# Patient Record
Sex: Male | Born: 1935 | Race: White | Hispanic: No | Smoking: Never smoker
Health system: Southern US, Community
[De-identification: ages and names within clinical notes are randomized; demographics above are authoritative.]

## PROBLEM LIST (undated history)

## (undated) DIAGNOSIS — I519 Heart disease, unspecified: Secondary | ICD-10-CM

## (undated) DIAGNOSIS — M21379 Foot drop, unspecified foot: Secondary | ICD-10-CM

## (undated) DIAGNOSIS — R7303 Prediabetes: Secondary | ICD-10-CM

## (undated) DIAGNOSIS — G6181 Chronic inflammatory demyelinating polyneuritis: Principal | ICD-10-CM

## (undated) DIAGNOSIS — R269 Unspecified abnormalities of gait and mobility: Secondary | ICD-10-CM

## (undated) DIAGNOSIS — I499 Cardiac arrhythmia, unspecified: Secondary | ICD-10-CM

## (undated) DIAGNOSIS — C61 Malignant neoplasm of prostate: Secondary | ICD-10-CM

## (undated) HISTORY — DX: Cardiac arrhythmia, unspecified: I49.9

## (undated) HISTORY — DX: Unspecified abnormalities of gait and mobility: R26.9

## (undated) HISTORY — DX: Prediabetes: R73.03

## (undated) HISTORY — DX: Heart disease, unspecified: I51.9

## (undated) HISTORY — DX: Malignant neoplasm of prostate: C61

## (undated) HISTORY — DX: Foot drop, unspecified foot: M21.379

## (undated) HISTORY — DX: Chronic inflammatory demyelinating polyneuritis: G61.81

---

## 2011-11-26 ENCOUNTER — Other Ambulatory Visit: Payer: Self-pay | Admitting: Neurology

## 2011-11-26 DIAGNOSIS — R209 Unspecified disturbances of skin sensation: Secondary | ICD-10-CM

## 2011-11-26 DIAGNOSIS — M216X9 Other acquired deformities of unspecified foot: Secondary | ICD-10-CM

## 2011-11-26 DIAGNOSIS — R269 Unspecified abnormalities of gait and mobility: Secondary | ICD-10-CM

## 2011-11-29 ENCOUNTER — Ambulatory Visit
Admission: RE | Admit: 2011-11-29 | Discharge: 2011-11-29 | Disposition: A | Discharge status: Home / Self Care | Payer: Medicare Other | Source: Ambulatory Visit | Attending: Neurology | Admitting: Neurology

## 2011-11-29 DIAGNOSIS — R209 Unspecified disturbances of skin sensation: Secondary | ICD-10-CM

## 2011-11-29 DIAGNOSIS — R269 Unspecified abnormalities of gait and mobility: Secondary | ICD-10-CM

## 2011-11-29 DIAGNOSIS — M216X9 Other acquired deformities of unspecified foot: Secondary | ICD-10-CM

## 2012-07-07 ENCOUNTER — Ambulatory Visit (INDEPENDENT_AMBULATORY_CARE_PROVIDER_SITE_OTHER): Payer: Medicare Other | Admitting: Neurology

## 2012-07-07 ENCOUNTER — Encounter: Payer: Self-pay | Admitting: Neurology

## 2012-07-07 VITALS — BP 130/80 | HR 86 | Ht 71.0 in | Wt 210.0 lb

## 2012-07-07 DIAGNOSIS — G6181 Chronic inflammatory demyelinating polyneuritis: Secondary | ICD-10-CM

## 2012-07-07 DIAGNOSIS — R269 Unspecified abnormalities of gait and mobility: Secondary | ICD-10-CM

## 2012-07-07 HISTORY — DX: Unspecified abnormalities of gait and mobility: R26.9

## 2012-07-07 HISTORY — DX: Chronic inflammatory demyelinating polyneuritis: G61.81

## 2012-07-07 NOTE — Progress Notes (Signed)
Manuel Sampson is a 76 years old right-handed Caucasian male   He was previously healthy, highly functional, was diagnosed with prostate cancer based on PSA elevation, had pelvic radiation since August 2012, lasting for 2 months, with seed implantation,  Around that period of time, he first noticed bilateral feet numbness tingling burning achy pain, couple weeks later, he noticed gait difficulty, stumbling, right worse than left, symptoms continued to get worse, plateued within 6-7 months, he had a slight improvement over the past couple months, but still had significant gait difficulty,  He denies incontinence, no bilateral hands paresthesia, no fingertips numbness tingling, no bad weakness,  MRI of lumbar reviewed, from [facility], L4-5 right posterior lateral disc extrusion with downward turning, with right L5 encroachment, moderate bilateral foraminal stenosis  Electrodiagnostic study in November 18 2011, showed prolonged F-wave latency of all the upper extremity motor nerves tested. Absence bilateral lower extremity motor and sensory responses, activity denervations involving bilateral distal leg muscles, consistent with demyelinating polyradiculoneuropathy   CSF showed elevated TP  72, glucose 79,  Labs, normal CMP, with exception of glucose 163, PEP,cbc, lyme titer, ssa,b, tsh,ace, ana, rpr, vit b12, crp,  UPDATE March 26th 2014:  He began to receive IVIG treatment from home health since Sep 2013,  1g/kg every 3 weeks, he tolerated the treatment very well, he reported significant improvement, he no longer walks with a cane,He go to church on Sunday without his ankle bracelet on sometimes. The numbness in his neck is now receding to his plantar feet only.    Review of Systems  Out of a complete 14 system review, the patient complains of only the following symptoms, and all other reviewed systems are negative.   Gait difficulty   Physical Exam  Neck: supple no carotid  bruits Respiratory: clear to auscultation bilaterally Cardiovascular: regular rate rhythm, systolic ejection murmur  Neurologic Exam  Mental Status: pleasant, awake, alert, cooperative to history, talking, and casual conversation. Cranial Nerves: CN II-XII pupils were equal round reactive to light.  Fundi were sharp bilaterally.  Extraocular movements were full.  Visual fields were full on confrontational test.  Facial sensation and strength were normal.  Hearing was intact to finger rubbing bilaterally.  Uvula tongue were midline.  Head turning and shoulder shrugging were normal and symmetric.  Tongue protrusion into the cheeks strength were normal.  Motor: mild limitation due to left shoulder pain,  he has no significant  bilateral upper extremity weakness. No bilateral proximal lower extremity weakness, Ankle dorsiflexion 2/3, plantar flexion 2/3, eversion 1/3, inversion 1/3.  Sensory: length dependent decreased light touch, pinprick to distal shin level, decreased bilateral toe proprioception 1/3 mistakes, and absent vibratory sensation to ankle level   Coordination: Normal finger-to-nose, heel-to-shin.  There was no dysmetria noticed. Gait and Station: need to push up from seated position, very unsteady, bilateral flail gait, right worse than left. obvious foot drop. improved gait with  AFO's in place.  Romberg sign: negative with feet apart in shoulder width Reflexes: Deep tendon reflexes: Biceps: 1/1, Brachioradialis: 1/1, Triceps: 1/1, Pateller: 0/0  Achilles: 0/0.  Plantar responses are flexor.   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   1. continue 1g/kg (0.5 g/kgx2) q 3 weeks. 2. RTC in 3 months.

## 2012-07-07 NOTE — Patient Instructions (Signed)
Continue home ivig treatment.

## 2012-07-07 NOTE — Telephone Encounter (Signed)
Error

## 2012-08-05 ENCOUNTER — Telehealth: Payer: Self-pay | Admitting: Neurology

## 2012-08-05 NOTE — Telephone Encounter (Signed)
Chart reviewed, he is to continue ivig 1gm/kg q 3weeks, (0.5mg  x2 days), no change in dosage,   Lupita Leash, please let her know.

## 2012-08-05 NOTE — Telephone Encounter (Signed)
Manuel Sampson From [facility] called. She was informed that patient was to have an increase in his IVIG but has not received new orders to increase dose. Shwe states that he is due on 4/30 and 5/1. She plans for 45 gms to be shipped. Please advise. [phone]

## 2012-08-06 NOTE — Telephone Encounter (Signed)
I called and spoke to [place] and since that time she had spoken to wife and the pt will be getting the same dosing and when pt has appt this may be changed at that time.   She verbalized understanding.

## 2012-10-07 ENCOUNTER — Encounter: Payer: Self-pay | Admitting: Neurology

## 2012-10-07 ENCOUNTER — Ambulatory Visit (INDEPENDENT_AMBULATORY_CARE_PROVIDER_SITE_OTHER): Payer: Medicare Other | Admitting: Neurology

## 2012-10-07 VITALS — BP 159/82 | HR 85 | Ht 71.0 in | Wt 159.0 lb

## 2012-10-07 DIAGNOSIS — R269 Unspecified abnormalities of gait and mobility: Secondary | ICD-10-CM

## 2012-10-07 DIAGNOSIS — G6181 Chronic inflammatory demyelinating polyneuritis: Secondary | ICD-10-CM

## 2012-10-07 NOTE — Progress Notes (Signed)
History of present illness: Manuel Sampson is a 77 years old right-handed Caucasian male   He was previously healthy, highly functional, was diagnosed with prostate cancer based on PSA elevation, had pelvic radiation since August 2012, lasting for 2 months, with seed implantation,  Around that period of time, he first noticed bilateral feet numbness tingling burning achy pain, couple weeks later, he noticed gait difficulty, stumbling, right worse than left, symptoms continued to get worse, plateued within 6-7 months, he had a slight improvement over the past couple months, but still had significant gait difficulty,  He denies incontinence, no bilateral hands paresthesia, no fingertips numbness tingling, no bad weakness,  MRI of lumbar reviewed, from [facility], L4-5 right posterior lateral disc extrusion with downward turning, with right L5 encroachment, moderate bilateral foraminal stenosis  Electrodiagnostic study in November 18 2011, showed prolonged F-wave latency of all the upper extremity motor nerves tested. Absence bilateral lower extremity motor and sensory responses, activity denervations involving bilateral distal leg muscles, consistent with demyelinating polyradiculoneuropathy   CSF showed elevated TP  72, glucose 79,  Labs, normal CMP, with exception of glucose 163, PEP,cbc, lyme titer, ssa,b, tsh,ace, ana, rpr, vit b12, crp,  He began to receive IVIG treatment from home health since Sep 2013,  1g/kg every 3 weeks, he tolerated the treatment very well, he reported significant improvement, he no longer walks with a cane,He go to church on Sunday without his ankle bracelet on sometimes. The numbness in his neck is now receding to his plantar feet only.  UPDATE June 26th 2014: He continues to show improvement with ivig, he is no longer staggering as much, walk with out assistant, wearing bilateral ankle brace, he had infusion every 3 weeks, no significant side effect noticed.  Review  of Systems  Out of a complete 14 system review, the patient complains of only the following symptoms, and all other reviewed systems are negative.   Gait difficulty  Physical Exam  Neck: supple no carotid bruits Respiratory: clear to auscultation bilaterally Cardiovascular: regular rate rhythm, systolic ejection murmur  Neurologic Exam  Mental Status: pleasant, awake, alert, cooperative to history, talking, and casual conversation. Cranial Nerves: CN II-XII pupils were equal round reactive to light.  Fundi were sharp bilaterally.  Extraocular movements were full.  Visual fields were full on confrontational test.  Facial sensation and strength were normal.  Hearing was intact to finger rubbing bilaterally.  Uvula tongue were midline.  Head turning and shoulder shrugging were normal and symmetric.  Tongue protrusion into the cheeks strength were normal.  Motor: mild limitation due to left shoulder pain,  he has no significant  bilateral upper extremity weakness. No bilateral proximal lower extremity weakness, Ankle dorsiflexion 2/3, plantar flexion 3/4, eversion 1/3, inversion 1/3.  Sensory: length dependent decreased light touch, pinprick to mid foot level, absent bilateral toe proprioception, and absent vibratory sensation to mid shin level   Coordination: Normal finger-to-nose, heel-to-shin.  There was no dysmetria noticed. Gait and Station: need to push up from seated position, very unsteady, bilateral flail gait, right worse than left. obvious foot drop. improved gait with  AFO's in place.  Romberg sign: negative with feet apart in shoulder width Reflexes: Deep tendon reflexes: Biceps: 1/1, Brachioradialis: 1/1, Triceps: 1/1, Pateller: 0/0  Achilles: 0/0.  Plantar responses are flexor.   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   1. continue 1g/kg (0.5 g/kgx2) q 3 weeks. 2.  RTC in 6-9 months

## 2012-12-22 ENCOUNTER — Telehealth: Payer: Self-pay | Admitting: *Deleted

## 2012-12-22 NOTE — Telephone Encounter (Signed)
Beryle Beams from [facility] called and relayed that reauthorization for IVIG therapy done (continuation of previous therapy)  [employer] Part D.  Good till 12-21-2013, this is for 45 Grams daily for 2 days every 3 wks.  (gammaguard 10%).  I called and LMVM for [place] that received message.

## 2013-03-25 ENCOUNTER — Other Ambulatory Visit: Payer: Self-pay | Admitting: Neurology

## 2013-03-25 NOTE — Telephone Encounter (Signed)
I called patient's home and left VM that I am faxing Rx for IVIG today.

## 2013-03-25 NOTE — Telephone Encounter (Signed)
Rx faxed to [facility] Per Vikki Ports, Charity fundraiser.

## 2013-03-30 ENCOUNTER — Telehealth: Payer: Self-pay | Admitting: Neurology

## 2013-03-30 NOTE — Telephone Encounter (Signed)
Judeth Cornfield from [facility] calling for clarification on patient's Gammagard liquid refill. Please call.

## 2013-04-01 NOTE — Telephone Encounter (Signed)
Spoke to [place] and she initially received a blank form with just a signature, it has since been completed and she received corrected form.

## 2013-04-27 ENCOUNTER — Telehealth: Payer: Self-pay | Admitting: *Deleted

## 2013-04-27 NOTE — Telephone Encounter (Signed)
This is for IVIG, which is handled by the nurses.  Will forward message.

## 2013-04-27 NOTE — Telephone Encounter (Signed)
I have been trying to get through to [facility] and have been put on hold twice today and no representative has picked up after 10-15 minutes.  I called Renaee Munda to see what she can do, she will try to download an application.  I have informed patient's wife and will let her know as soon as we get the information.

## 2013-04-28 ENCOUNTER — Telehealth: Payer: Self-pay | Admitting: Neurology

## 2013-04-28 NOTE — Telephone Encounter (Signed)
IVIG is handled by nurses.  By viewing the chart, Butch Penny is already working on this.

## 2013-04-28 NOTE — Telephone Encounter (Signed)
Dr. Krista Blue I heard from the rep from [facility] and his IVIG will have to be changed to Gamunex C 10% from Gammagard Liquid 10% , as that is the IVIG in his formulary, (patient changed insurance.)   Please write new Rx and I will fax to specialty pharmacy.

## 2013-04-28 NOTE — Telephone Encounter (Signed)
Spoke to [place], she has received prescription.

## 2013-04-28 NOTE — Telephone Encounter (Signed)
New prescription was written and faxed to [facility], confirmation received.  Authorization # [id number].  I spoke to wife and she is aware, pharmacy will ship medication.

## 2013-04-28 NOTE — Telephone Encounter (Signed)
Gina with [facility] called to check the status of a pre authorization form for Gamunex for this patient.  She stated that she needed this today if possible so that she can send to patient.  Please call [phone] and ask for Gina.  Thank you.

## 2013-05-02 ENCOUNTER — Ambulatory Visit: Payer: Medicare Other | Admitting: Neurology

## 2013-05-24 ENCOUNTER — Telehealth: Payer: Self-pay | Admitting: Neurology

## 2013-05-24 NOTE — Telephone Encounter (Signed)
Colletta Maryland with [employer] calling to state that patient was on Gammagard  IV - insurance required him to be on Gamunex-C. Patient has had 2 treatments of Gamunex-C and does not feel that is working as well. He is unstable walking. He is weaker. Colletta Maryland questioning whether Dr. Krista Blue can contact patient's insurance company stating Gammagard as medically necessary for patient.

## 2013-05-25 NOTE — Telephone Encounter (Signed)
Butch Penny: Please call patient's to find out eventually change of IVIG has made a difference, if patient agrees that there is significant difference,  please   find out the procedure I need to go through to talk with his insurance company about specific kind of IVIG

## 2013-06-03 NOTE — Telephone Encounter (Signed)
I spoke with patient and he stated he doesn't feel he has as good a benefit with the Gamunex-C as he did with the Gammagard.  His wife thinks he should give it a little more time to see if it improves.  I also spoke to [facility] the pharmacist with [employer] and he is due to get his shipment for next infusion sent on 06-08-13, so she requested no change be may right now, otherwise he may miss the dose for next week.    I spoke to [employer] and they informed me that we would have to call in to do a formulary exception, and get it authorized if the doctor wanted to change the medication.

## 2013-06-03 NOTE — Telephone Encounter (Signed)
That is OK. Let him complete this infusion as planned to see how he responds to it.    We will check his response on his follow up visit in March 6th.

## 2013-06-06 NOTE — Telephone Encounter (Signed)
Left message that the doctor wants him to complete his infusion this month and will check his response on his follow up visit on 06-17-13.

## 2013-06-17 ENCOUNTER — Encounter (INDEPENDENT_AMBULATORY_CARE_PROVIDER_SITE_OTHER): Payer: Self-pay

## 2013-06-17 ENCOUNTER — Encounter: Payer: Self-pay | Admitting: Neurology

## 2013-06-17 ENCOUNTER — Ambulatory Visit (INDEPENDENT_AMBULATORY_CARE_PROVIDER_SITE_OTHER): Payer: Medicare Other | Admitting: Neurology

## 2013-06-17 VITALS — BP 153/77 | HR 72 | Ht 72.0 in | Wt 210.0 lb

## 2013-06-17 DIAGNOSIS — G6181 Chronic inflammatory demyelinating polyneuritis: Secondary | ICD-10-CM

## 2013-06-17 MED ORDER — DULOXETINE HCL 60 MG PO CPEP: 60.0000 mg | ORAL_CAPSULE | Freq: Every day | ORAL | Status: DC

## 2013-06-17 MED ORDER — GABAPENTIN 100 MG PO CAPS: 300.0000 mg | ORAL_CAPSULE | Freq: Three times a day (TID) | ORAL | Status: DC

## 2013-06-17 NOTE — Progress Notes (Signed)
History of present illness: Manuel Sampson is a 78 years old right-handed Caucasian male   He was previously healthy, highly functional, was diagnosed with prostate cancer based on PSA elevation, had pelvic radiation since August 2012, lasting for 2 months, with seed implantation,  Around that period of time, he first noticed bilateral feet numbness tingling burning achy pain, couple weeks later, he noticed gait difficulty, stumbling, right worse than left, symptoms continued to get worse, plateued within 6-7 months, he had a slight improvement over the past couple months, but still had significant gait difficulty,  He denies incontinence, no bilateral hands paresthesia, no fingertips numbness tingling, no bad weakness,  MRI of lumbar reviewed, from [facility], L4-5 right posterior lateral disc extrusion with downward turning, with right L5 encroachment, moderate bilateral foraminal stenosis  Electrodiagnostic study in November 18, 2011, showed prolonged F-wave latency of all the upper extremity motor nerves tested. Absence bilateral lower extremity motor and sensory responses, activity denervations involving bilateral distal leg muscles, consistent with demyelinating polyradiculoneuropathy   CSF showed elevated TP  72, glucose 79,  Labs, normal CMP, with exception of glucose 163, PEP,cbc, lyme titer, ssa,b, tsh,ace, ana, rpr, vit b12, crp,  He began to receive IVIG treatment from home health since Sep 2013,  1g/kg every 3 weeks, he tolerated the treatment very well, he reported significant improvement, he no longer walks with a cane, He go to church on Sunday without his ankle bracelet on sometimes. The numbness in his neck is now receding to his plantar feet only.  UPDATE June 26th 2014: He continues to show improvement with ivig, he is no longer staggering as much, walk with out assistant, wearing bilateral ankle brace, he had infusion every 3 weeks, no significant side effect  noticed.  UPDATE March 6th 2015: Overall, he does very well, work 8 hours each day, he was switched from [place] to [place] since February 2015. He completed two infusions, through home health agent [facility], last infusion was March second, and third, he complains of increased bilateral feet burning, numbness, especially at the ball of his feet, he continue to walk with a brace, which has been very helpful  He has been taking Cymbalta 60 mg every morning for a while, was not sure it is helping him, he denies significant side effect  He denies bilateral upper extremity symptoms, no incontinence  Review of Systems  Out of a complete 14 system review, the patient complains of only the following symptoms, and all other reviewed systems are negative.   Gait difficulty, bilateral feet paresthesia   Physical Exam  Neck: supple no carotid bruits Respiratory: clear to auscultation bilaterally Cardiovascular: regular rate rhythm, systolic ejection murmur  Neurologic Exam  Mental Status: pleasant, awake, alert, cooperative to history, talking, and casual conversation. Cranial Nerves: CN II-XII pupils were equal round reactive to light.  Fundi were sharp bilaterally.  Extraocular movements were full.  Visual fields were full on confrontational test.  Facial sensation and strength were normal.  Hearing was intact to finger rubbing bilaterally.  Uvula tongue were midline.  Head turning and shoulder shrugging were normal and symmetric.  Tongue protrusion into the cheeks strength were normal.  Motor:   he has no significant  bilateral upper extremity weakness. No bilateral proximal lower extremity weakness, Ankle dorsiflexion 2/3, plantar flexion 3/4, eversion  2 /3, inversion  2 /3.  Sensory: length dependent decreased light touch, pinprick to  ankle  level,  mild decreased right toe  proprioception, and absent  vibratory sensation to  ankle level.  Coordination: Normal finger-to-nose, heel-to-shin.  There  was no dysmetria noticed. Gait and Station: need to push up from seated position, very unsteady, bilateral flail gait, right worse than left. obvious foot drop. improved gait with  AFO's in place.  Romberg sign:  mildly positive  with feet apart in shoulder width Reflexes: Deep tendon reflexes: Biceps: 1/1, Brachioradialis: 1/1, Triceps: 1/1, Pateller: 0/0  Achilles: 0/0.  Plantar responses are flexor.   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 continue 1g/kg (0.5 g/kgx2) q 3 weeks. 2. EMG/NCS. 3. Add on Neurontin for symptomatic control.

## 2013-06-20 ENCOUNTER — Telehealth: Payer: Self-pay | Admitting: Neurology

## 2013-06-20 MED ORDER — DULOXETINE HCL 60 MG PO CPEP: 60.0000 mg | ORAL_CAPSULE | Freq: Every day | ORAL | Status: DC

## 2013-06-20 MED ORDER — GABAPENTIN 100 MG PO CAPS: 300.0000 mg | ORAL_CAPSULE | Freq: Three times a day (TID) | ORAL | Status: DC

## 2013-06-20 NOTE — Telephone Encounter (Signed)
Rx's have been resent.  Incorrect pharmacy removed from chart

## 2013-06-20 NOTE — Telephone Encounter (Signed)
Pt's wife called regarding the prescriptions that were prescribed on 06-17-13.  [facility]  [facility] [place]  Thank you

## 2013-06-21 ENCOUNTER — Telehealth: Payer: Self-pay | Admitting: *Deleted

## 2013-06-21 NOTE — Telephone Encounter (Signed)
When patient was here for OV[facility] [place] [facility], and this is where the Rx's were sent at that time.  Since then, I have resent them to the correct pharmacy.  [facility] [place], spoke with pharmacist, she said they will cancel the Rx's there.  She said since [facility], this is something they could have done internally.  [facility], they [facility]'s without any problems.  I called the patient back, they are aware.

## 2013-08-01 ENCOUNTER — Encounter (INDEPENDENT_AMBULATORY_CARE_PROVIDER_SITE_OTHER): Payer: Self-pay

## 2013-08-01 ENCOUNTER — Ambulatory Visit (INDEPENDENT_AMBULATORY_CARE_PROVIDER_SITE_OTHER): Payer: Medicare Other | Admitting: Neurology

## 2013-08-01 DIAGNOSIS — G6181 Chronic inflammatory demyelinating polyneuritis: Secondary | ICD-10-CM

## 2013-08-01 DIAGNOSIS — Z0289 Encounter for other administrative examinations: Secondary | ICD-10-CM

## 2013-08-01 DIAGNOSIS — R269 Unspecified abnormalities of gait and mobility: Secondary | ICD-10-CM

## 2013-08-01 NOTE — Procedures (Signed)
   NCS (NERVE CONDUCTION STUDY) WITH EMG (ELECTROMYOGRAPHY) REPORT   STUDY DATE: April 20th 2015 PATIENT NAME: Manuel Sampson DOB: 10-Aug-1935 MRN: [id number]    TECHNOLOGIST: Laretta Alstrom ELECTROMYOGRAPHER: Marcial Pacas M.D.  CLINICAL INFORMATION:  78 years old male, with previous electrodiagnostic study proven demyelinating polyradiculoneuropathies, has continued steady improvement with monthly IVIG improvement.  FINDINGS: NERVE CONDUCTION STUDY: Bilateral peroneal sensory responses, bilateral peroneal, tibial motor responses were absent.  Right ulnar sensory response showed moderately prolonged peak latency, with mildly decreased snap amplitude. Right median, and ulnar motor responses showed mild to moderately prolonged distal latency, prolonged F-wave latency, with preserved Cmap amplitude, conduction velocity.  NEEDLE ELECTROMYOGRAPHY: Selected needle examinations were performed at bilateral lower extremity muscles, right lumbosacral paraspinal muscles.  Bilateral tibialis anterior, medial gastrocnemius, increased insertion activity, 1-2 plus spontaneous activity, enlarged complex motor unit potential with decreased recruitment patterns.  Bilateral vastus lateralis: Increased insertion activity, no spontaneous activity, enlarged complex motor unit potential, with mildly decreased recruitment patterns.  Right biceps femoris long head: Normally insertion activity, no spontaneous activity, mixture of normal, some enlarged motor unit potential, with mildly decreased recruitment patterns.  There was no spontaneous activity at right lumbosacral paraspinal muscles, right L4, L5, S1  IMPRESSION:   This is an abnormal study. There is electrodiagnostic evidence of chronic demyelinating polyradiculoneuropathy, as evidenced by prolonged distal latency, F-wave latency, there was also axonal features, with mild to moderate active denervation at bilateral lower extremity EMG.   In comparison to  previous study in August 2013, there is mild improvement, with appearance of right ulnar and median sensory responses, improved right ulnar, median motor distal latency, conduction velocity, F wave latency, C. map amplitude.  INTERPRETING PHYSICIAN:   Marcial Pacas M.D. Ph.D. [facility] [address] [place], [place] [address] [phone]

## 2014-01-27 ENCOUNTER — Telehealth: Payer: Self-pay | Admitting: Neurology

## 2014-01-27 NOTE — Telephone Encounter (Signed)
Stephanie from [facility] checking to see if we received the fax about patient's IVIG, states that she needs to ship it for his treatment, please return call and advise.

## 2014-01-27 NOTE — Telephone Encounter (Signed)
Called and spoke to pharmacist  And she will send fax to me for Dr.Yan.

## 2014-01-30 NOTE — Telephone Encounter (Signed)
Fax sent.

## 2014-01-31 ENCOUNTER — Ambulatory Visit: Payer: Medicare Other | Admitting: Neurology

## 2014-02-22 ENCOUNTER — Encounter: Payer: Self-pay | Admitting: Neurology

## 2014-02-22 ENCOUNTER — Ambulatory Visit (INDEPENDENT_AMBULATORY_CARE_PROVIDER_SITE_OTHER): Payer: Medicare Other | Admitting: Neurology

## 2014-02-22 VITALS — BP 143/81 | HR 84 | Ht 69.0 in | Wt 202.0 lb

## 2014-02-22 DIAGNOSIS — R269 Unspecified abnormalities of gait and mobility: Secondary | ICD-10-CM

## 2014-02-22 DIAGNOSIS — G6181 Chronic inflammatory demyelinating polyneuritis: Secondary | ICD-10-CM

## 2014-02-22 NOTE — Progress Notes (Signed)
History of present illness: [name] is a 78 years old right-handed Caucasian male   He was previously healthy, highly functional, was diagnosed with prostate cancer based on PSA elevation, had pelvic radiation since August 2012, lasting for 2 months, with seed implantation,  Around 2012, he first noticed bilateral feet numbness tingling burning achy pain, couple weeks later, he noticed gait difficulty, stumbling, right worse than left, symptoms continued to get worse, plateued within 6-7 months, he had a slight improvement over the past couple months, but still had significant gait difficulty,  He denies incontinence, no bilateral hands paresthesia, no fingertips numbness tingling, no bad weakness,  MRI of lumbar reviewed, from [facility], L4-5 right posterior lateral disc extrusion with downward turning, with right L5 encroachment, moderate bilateral foraminal stenosis  Electrodiagnostic study in November 18, 2011, showed prolonged F-wave latency of all the upper extremity motor nerves tested. Absence bilateral lower extremity motor and sensory responses, activity denervations involving bilateral distal leg muscles, consistent with demyelinating polyradiculoneuropathy   CSF showed elevated TP  72, glucose 79,  Labs, normal CMP, with exception of glucose 163, PEP,cbc, lyme titer, ssa,b, tsh,ace, ana, rpr, vit b12, crp,  He began to receive IVIG treatment from home health since Sep 2013,  1g/kg every 3 weeks, he tolerated the treatment very well, he reported significant improvement, he no longer walks with a cane, He go to church on Sunday without his ankle bracelet on sometimes. The numbness in his neck is now receding to his plantar feet only.  UPDATE June 26th 2014: He continues to show improvement with ivig, he is no longer staggering as much, walk with out assistant, wearing bilateral ankle brace, he had infusion every 3 weeks, no significant side effect noticed.  UPDATE March 6th  2015: Overall, he does very well, work 8 hours each day, he was switched from [place] to [place] since February 2015. He completed two infusions, through home health agent [facility], last infusion was March second, and third, he complains of increased bilateral feet burning, numbness, especially at the ball of his feet, he continue to walk with a brace, which has been very helpful  He has been taking Cymbalta 60 mg every morning for a while, was not sure it is helping him, he denies significant side effect  He denies bilateral upper extremity symptoms, no incontinence  UPDATE Nov 11th 2015: He is receiving ivig 0.5 g/kg x2, every 3 weeks, He continue to show mild steady improvement over time, he is no longer wearing ankle brace, he now complains of low back pain, no bowel bladder incontinence,  Review of Systems  Out of a complete 14 system review, the patient complains of only the following symptoms, and all other reviewed systems are negative.   Gait difficulty, bilateral feet paresthesia   Physical Exam  Neck: supple no carotid bruits Respiratory: clear to auscultation bilaterally Cardiovascular: regular rate rhythm, systolic ejection murmur  Neurologic Exam  Mental Status: pleasant, awake, alert, cooperative to history, talking, and casual conversation. Cranial Nerves: CN II-XII pupils were equal round reactive to light.  Fundi were sharp bilaterally.  Extraocular movements were full.  Visual fields were full on confrontational test.  Facial sensation and strength were normal.  Hearing was intact to finger rubbing bilaterally.  Uvula tongue were midline.  Head turning and shoulder shrugging were normal and symmetric.  Tongue protrusion into the cheeks strength were normal.  Motor:   he has no significant  bilateral upper extremity weakness. No bilateral proximal  lower extremity weakness, Ankle dorsiflexion 3/3, plantar flexion 4/5 minus, eversion  2 /3, inversion  2 /3.  Sensory: length  dependent decreased light touch, pinprick to ankle  level,  mild decreased right toe  proprioception, and absent vibratory sensation to  ankle level.  Coordination: Normal finger-to-nose, heel-to-shin.  There was no dysmetria noticed. Gait and Station: need to push up from seated position, very unsteady, bilateral flail gait, right worse than left. obvious foot drop. Romberg signs was negative Reflexes: Deep tendon reflexes: Biceps: 1/1, Brachioradialis: 1/1, Triceps: 1/1, Pateller: 0/0  Achilles: 0/0.  Plantar responses are flexor.   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 continue 1g/kg (0.5 g/kgx2) q 3 weeks. 2. Return to clinic in 4-6 months

## 2014-07-28 ENCOUNTER — Other Ambulatory Visit: Payer: Self-pay | Admitting: Neurology

## 2015-01-27 ENCOUNTER — Other Ambulatory Visit: Payer: Self-pay | Admitting: Neurology

## 2015-01-28 NOTE — Telephone Encounter (Signed)
Has appt scheduled.

## 2015-02-01 ENCOUNTER — Telehealth: Payer: Self-pay | Admitting: Neurology

## 2015-02-01 NOTE — Telephone Encounter (Signed)
Pt's wife called stating RN Cyril [phone])called her to let her know needed prior auth for [facility] 5 GM/50ML SOLN . She said RX would not be approved until he sees Dr Krista Blue. Pt is to have treatment 11/3 and 11/4. Please call and advise at [phone] or (C) [phone]

## 2015-02-01 NOTE — Telephone Encounter (Signed)
Completed and faxed back to [facility] - called patient's wife to let her know it has been completed.

## 2015-02-14 ENCOUNTER — Encounter: Payer: Self-pay | Admitting: Neurology

## 2015-02-14 ENCOUNTER — Ambulatory Visit (INDEPENDENT_AMBULATORY_CARE_PROVIDER_SITE_OTHER): Payer: Medicare Other | Admitting: Neurology

## 2015-02-14 VITALS — BP 145/68 | HR 68 | Ht 69.0 in | Wt 191.0 lb

## 2015-02-14 DIAGNOSIS — R269 Unspecified abnormalities of gait and mobility: Secondary | ICD-10-CM | POA: Diagnosis not present

## 2015-02-14 DIAGNOSIS — G6181 Chronic inflammatory demyelinating polyneuritis: Secondary | ICD-10-CM | POA: Diagnosis not present

## 2015-02-14 NOTE — Progress Notes (Signed)
PATIENT: Manuel Sampson DOB: [birth year]  Chief Complaint  Patient presents with  . CIDP    He is here with his wife, Manuel Sampson.  Feels he is doing well on IVIG every three weeks.  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 In June, he develped an irregular heartbeat that required a cardioversion.  He is now on medication and has not had any further problems.     HISTORICAL  Andrea Colglazier, seen in refer by    History of present illness: Manuel Sampson is a 79 years old right-handed Caucasian male   He was previously healthy, highly functional, was diagnosed with prostate cancer based on PSA elevation, had pelvic radiation since August 2012, lasting for 2 months, with seed implantation,  Around 2012, he first noticed bilateral feet numbness tingling burning achy pain, couple weeks later, he noticed gait difficulty, stumbling, right worse than left, symptoms continued to get worse, plateued within 6-7 months, he had a slight improvement over the past couple months, but still had significant gait difficulty,  He denies incontinence, no bilateral hands paresthesia, no fingertips numbness tingling, no bad weakness,  MRI of lumbar reviewed, from [facility], L4-5 right posterior lateral disc extrusion with downward turning, with right L5 encroachment, moderate bilateral foraminal stenosis  Electrodiagnostic study in November 18, 2011, showed prolonged F-wave latency of all the upper extremity motor nerves tested. Absence bilateral lower extremity motor and sensory responses, activity denervations involving bilateral distal leg muscles, consistent with demyelinating polyradiculoneuropathy   CSF showed elevated TP  72, glucose 79,  Labs, normal CMP, with exception of glucose 163, PEP,cbc, lyme titer, ssa,b, tsh,ace, ana, rpr, vit b12, crp,  He began to receive IVIG treatment from home health since Sep 2013,  1g/kg every 3 weeks, he tolerated the treatment very well, he reported  significant improvement, he no longer walks with a cane, He go to church on Sunday without his ankle bracelet on sometimes. The numbness in his neck is now receding to his plantar feet only.  UPDATE June 26th 2014: He continues to show improvement with ivig, he is no longer staggering as much, walk with out assistant, wearing bilateral ankle brace, he had infusion every 3 weeks, no significant side effect noticed.  UPDATE March 6th 2015: Overall, he does very well, work 8 hours each day, he was switched from [place] to [place] since February 2015. He completed two infusions, through home health agent [facility], last infusion was March second, and third, he complains of increased bilateral feet burning, numbness, especially at the ball of his feet, he continue to walk with a brace, which has been very helpful  He has been taking Cymbalta 60 mg every morning for a while, was not sure it is helping him, he denies significant side effect  He denies bilateral upper extremity symptoms, no incontinence  UPDATE Nov 11th 2015: He is receiving ivig 0.5 g/kg x2, every 3 weeks, He continue to show mild steady improvement over time, he is no longer wearing ankle brace, he now complains of low back pain, no bowel bladder incontinence  UPDATE Feb 14 2015: He no longer wear ankle brace, ambulate without assistance, complains of bilateral plantar surface pain, especially after prolonged walking In June 2016 he developed atrial fibrillation, went through cardiac conversion successfully, now he is taking anticoagulations Xelrato  He still receiving IVIG, 1 g/kg every 3 weeks   REVIEW OF SYSTEMS: Full 14 system review of systems performed and notable  only for  gait difficulty  ALLERGIES: No Known Allergies  HOME MEDICATIONS: Current Outpatient Prescriptions  Medication Sig Dispense Refill  . amiodarone (PACERONE) 200 MG tablet Take 200 mg by mouth 2 (two) times daily.    . DULoxetine (CYMBALTA) 60 MG  capsule TAKE 1 CAPSULE (60 MG TOTAL) BY MOUTH DAILY. 90 capsule 0  . GAMMAGARD 5 GM/50ML SOLN     . metFORMIN (GLUCOPHAGE) 500 MG tablet Take by mouth daily.    . metoprolol tartrate (LOPRESSOR) 25 MG tablet Take 25 mg by mouth daily.    . rivaroxaban (XARELTO) 20 MG TABS tablet Take 20 mg by mouth daily.     No current facility-administered medications for this visit.    PAST MEDICAL HISTORY: Past Medical History  Diagnosis Date  . Gait abnormality   . Foot drop   . Heart disease   . Prostate cancer ([place])   . CIDP (chronic inflammatory demyelinating polyneuropathy) ([place])   . CIDP (chronic inflammatory demyelinating polyneuropathy) ([place]) 07/07/2012  . Abnormality of gait 07/07/2012  . Pre-diabetes   . Irregular heartbeat     PAST SURGICAL HISTORY: No past surgical history on file.  FAMILY HISTORY: No family history on file.  SOCIAL HISTORY:  Social History   Social History  . Marital Status: Married    Spouse Name: Webb Silversmith  . Number of Children: 2  . Years of Education: N/A   Occupational History  . Not on file.   Social History Main Topics  . Smoking status: Never Smoker   . Smokeless tobacco: Never Used  . Alcohol Use: No  . Drug Use: No  . Sexual Activity: Not on file   Other Topics Concern  . Not on file   Social History Narrative   Patient lives at home with his wife Manuel Sampson). Patient is still working. Two children.Right handed.   Caffeine- two cups.           PHYSICAL EXAM   Filed Vitals:   02/14/15 1159  BP: 145/68  Pulse: 68  Height: 5\' 9"  (1.753 m)  Weight: 191 lb (86.637 kg)    Not recorded      Body mass index is 28.19 kg/(m^2).  PHYSICAL EXAMNIATION:  Gen: NAD, conversant, well nourised, obese, well groomed                     Cardiovascular: Regular rate rhythm, no peripheral edema, warm, nontender. Eyes: Conjunctivae clear without exudates or hemorrhage Neck: Supple, no carotid bruise. Pulmonary: Clear to auscultation bilaterally     NEUROLOGICAL EXAM:  MENTAL STATUS: Speech:    Speech is normal; fluent and spontaneous with normal comprehension.  Cognition:     Orientation to time, place and person     Normal recent and remote memory     Normal Attention span and concentration     Normal Language, naming, repeating,spontaneous speech     Fund of knowledge   CRANIAL NERVES: CN II: Visual fields are full to confrontation. Pupils are round equal and briskly reactive to light. CN III, IV, VI: extraocular movement are normal. No ptosis. CN V: Facial sensation is intact to pinprick in all 3 divisions bilaterally. Corneal responses are intact.  CN VII: Face is symmetric with normal eye closure and smile. CN VIII: Hearing is normal to rubbing fingers CN IX, X: Palate elevates symmetrically. Phonation is normal. CN XI: Head turning and shoulder shrug are intact CN XII: Tongue is midline with normal movements and no atrophy.  MOTOR: He has mild bilateral shoulder abduction external rotation weakness, mild bilateral hand grip weakness, he has mild bilateral hip flexion weakness, moderate ankle dorsiflexion plantar flexion weakness, left worse than right.  REFLEXES: Reflexes are 1 and symmetric at the biceps, triceps, absent at knees, and ankles. Plantar responses are flexor.  SENSORY: Length dependent decreased to light touch, pinprick and vibratory sensation to distal shin level.  COORDINATION: Rapid alternating movements and fine finger movements are intact. There is no dysmetria on finger-to-nose and heel-knee-shin.    GAIT/STANCE: Need to push up to get up from seated position, bilateral flailed gait, left worse than right   DIAGNOSTIC DATA (LABS, IMAGING, TESTING) - I reviewed patient records, labs, notes, testing and imaging myself where available.   ASSESSMENT AND PLAN  Shawnee Higham is a 79 y.o. male   Chronic inflammatory demyelinating polyneuropathy  He has significant bilateral foot drop, gait  difficulty,   Started IVIG treatment since September 2013, he continue to feel the persistent benefit  Continue 1g/kg (0.5 g/kgx2) q 3 weeks. New-onset atrial fibrillation  Keep xelrato  Fall precaution  Marcial Pacas, M.D. Ph.D.  [facility] [address] [place], [place] [address] Ph: [phone] Fax: [phone]  CC: Referring Provider

## 2015-04-24 ENCOUNTER — Telehealth: Payer: Self-pay | Admitting: *Deleted

## 2015-04-24 NOTE — Telephone Encounter (Signed)
Called [employer] [phone]) - prior authorization approved through 04/13/16 [id number].  [facility] aware.

## 2016-02-14 ENCOUNTER — Encounter: Payer: Self-pay | Admitting: Neurology

## 2016-02-14 ENCOUNTER — Ambulatory Visit: Payer: Medicare Other | Admitting: Neurology

## 2016-02-14 ENCOUNTER — Telehealth: Payer: Self-pay | Admitting: Neurology

## 2016-02-14 ENCOUNTER — Ambulatory Visit (INDEPENDENT_AMBULATORY_CARE_PROVIDER_SITE_OTHER): Payer: Medicare Other | Admitting: Neurology

## 2016-02-14 VITALS — BP 151/68 | HR 60 | Ht 69.0 in | Wt 191.0 lb

## 2016-02-14 DIAGNOSIS — R269 Unspecified abnormalities of gait and mobility: Secondary | ICD-10-CM | POA: Diagnosis not present

## 2016-02-14 DIAGNOSIS — G6181 Chronic inflammatory demyelinating polyneuritis: Secondary | ICD-10-CM | POA: Diagnosis not present

## 2016-02-14 MED ORDER — DULOXETINE HCL 60 MG PO CPEP: 60.0000 mg | ORAL_CAPSULE | Freq: Every day | ORAL | 4 refills | Status: DC

## 2016-02-14 NOTE — Telephone Encounter (Signed)
Patient has an appt today and the paperwork will be completed and faxed back to [facility].

## 2016-02-14 NOTE — Progress Notes (Signed)
PATIENT: Manuel Sampson DOB: 08-25-1935  Chief Complaint  Patient presents with  . CIDP    Manuel Sampson has no new concerns today.  Manuel Sampson would like to discuss changing his IVIG infusions to once monthly rather than every three weeks.     HISTORICAL  Manuel Sampson, seen in refer by    History of present illness: Manuel Sampson is a 80 years old right-handed Caucasian male   Manuel Sampson was previously healthy, highly functional, was diagnosed with prostate cancer based on PSA elevation, had pelvic radiation since August 2012, lasting for 2 months, with seed implantation,  Around 2012, Manuel Sampson first noticed bilateral feet numbness tingling burning achy pain, couple weeks later, Manuel Sampson noticed gait difficulty, stumbling, right worse than left, symptoms continued to get worse, plateued within 6-7 months, Manuel Sampson had a slight improvement over the past couple months, but still had significant gait difficulty,  Manuel Sampson denies incontinence, no bilateral hands paresthesia, no fingertips numbness tingling, no bad weakness,  MRI of lumbar reviewed, from [facility], L4-5 right posterior lateral disc extrusion with downward turning, with right L5 encroachment, moderate bilateral foraminal stenosis  Electrodiagnostic study in November 18, 2011, showed prolonged F-wave latency of all the upper extremity motor nerves tested. Absence bilateral lower extremity motor and sensory responses, activity denervations involving bilateral distal leg muscles, consistent with demyelinating polyradiculoneuropathy   CSF showed elevated TP  72, glucose 79,  Labs, normal CMP, with exception of glucose 163, PEP,cbc, lyme titer, ssa,b, tsh,ace, ana, rpr, vit b12, crp,  Manuel Sampson began to receive IVIG treatment from home health since Sep 2013,  1g/kg every 3 weeks, Manuel Sampson tolerated the treatment very well, Manuel Sampson reported significant improvement, Manuel Sampson no longer walks with a cane, Manuel Sampson go to church on Sunday without his ankle bracelet on sometimes. The numbness in his neck is now  receding to his plantar feet only.  UPDATE June 26th 2014: Manuel Sampson continues to show improvement with ivig, Manuel Sampson is no longer staggering as much, walk with out assistant, wearing bilateral ankle brace, Manuel Sampson had infusion every 3 weeks, no significant side effect noticed.  UPDATE March 6th 2015: Overall, Manuel Sampson does very well, work 8 hours each day, Manuel Sampson was switched from [place] to [place] since February 2015. Manuel Sampson completed two infusions, through home health agent [facility], last infusion was March second, and third, Manuel Sampson complains of increased bilateral feet burning, numbness, especially at the ball of his feet, Manuel Sampson continue to walk with a brace, which has been very helpful  Manuel Sampson has been taking Cymbalta 60 mg every morning for a while, was not sure it is helping him, Manuel Sampson denies significant side effect  Manuel Sampson denies bilateral upper extremity symptoms, no incontinence  UPDATE Nov 11th 2015: Manuel Sampson is receiving ivig 0.5 g/kg x2, every 3 weeks, Manuel Sampson continue to show mild steady improvement over time, Manuel Sampson is no longer wearing ankle brace, Manuel Sampson now complains of low back pain, no bowel bladder incontinence  UPDATE Feb 14 2015: Manuel Sampson no longer wear ankle brace, ambulate without assistance, complains of bilateral plantar surface pain, especially after prolonged walking In June 2016 Manuel Sampson developed atrial fibrillation, went through cardiac conversion successfully, now Manuel Sampson is taking anticoagulations Xelrato  Manuel Sampson still receiving IVIG, 1 g/kg every 3 weeks  UPDATE Feb 14 2016: Manuel Sampson continued to show mild improvement with continued IVIG treatment, the next 10% 1 g/kg (Manuel Sampson weighed 86 kg,= use 90 g, divided into 2 doses every 3 weeks), Manuel Sampson is getting home infusion, hope to change the schedule to  every month,  Manuel Sampson now ambulate without ankle brace, denies upper extremity involvement. Manuel Sampson does have numbness, deep achy pain of bilateral foot. Cymbalta 60 mg has been helpful,  REVIEW OF SYSTEMS: Full 14 system review of systems performed and notable only for   gait difficulty  ALLERGIES: Allergies  Allergen Reactions  . Ezetimibe     Other reaction(s): Arthralgias (intolerance)  . Fenofibrate Micronized     Other reaction(s): Arthralgias (intolerance)  . Statins     Other reaction(s): Arthralgias (intolerance)    HOME MEDICATIONS: Current Outpatient Prescriptions  Medication Sig Dispense Refill  . amiodarone (PACERONE) 200 MG tablet Take 200 mg by mouth 2 (two) times daily.    . DULoxetine (CYMBALTA) 60 MG capsule TAKE 1 CAPSULE (60 MG TOTAL) BY MOUTH DAILY. 90 capsule 0  . GAMMAGARD 5 GM/50ML SOLN     . metFORMIN (GLUCOPHAGE) 500 MG tablet Take by mouth daily.    . metoprolol tartrate (LOPRESSOR) 25 MG tablet Take 25 mg by mouth daily.    . rivaroxaban (XARELTO) 20 MG TABS tablet Take 20 mg by mouth daily.     No current facility-administered medications for this visit.     PAST MEDICAL HISTORY: Past Medical History:  Diagnosis Date  . Abnormality of gait 07/07/2012  . CIDP (chronic inflammatory demyelinating polyneuropathy) ([place])   . CIDP (chronic inflammatory demyelinating polyneuropathy) ([place]) 07/07/2012  . Foot drop   . Gait abnormality   . Heart disease   . Irregular heartbeat   . Pre-diabetes   . Prostate cancer ([place])     PAST SURGICAL HISTORY: No past surgical history on file.  FAMILY HISTORY: No family history on file.  SOCIAL HISTORY:  Social History   Social History  . Marital status: Married    Spouse name: Webb Silversmith  . Number of children: 2  . Years of education: N/A   Occupational History  . Not on file.   Social History Main Topics  . Smoking status: Never Smoker  . Smokeless tobacco: Never Used  . Alcohol use No  . Drug use: No  . Sexual activity: Not on file   Other Topics Concern  . Not on file   Social History Narrative   Patient lives at home with his wife [name]). Patient is still working. Two children.Right handed.   Caffeine- two cups.           PHYSICAL EXAM   Vitals:    02/14/16 1045  BP: (!) 151/68  Pulse: 60  Weight: 191 lb (86.6 kg)  Height: 5\' 9"  (1.753 m)    Not recorded      Body mass index is 28.21 kg/m.  PHYSICAL EXAMNIATION:  Gen: NAD, conversant, well nourised, obese, well groomed                     Cardiovascular: Regular rate rhythm, no peripheral edema, warm, nontender. Eyes: Conjunctivae clear without exudates or hemorrhage Neck: Supple, no carotid bruise. Pulmonary: Clear to auscultation bilaterally   NEUROLOGICAL EXAM:  MENTAL STATUS: Speech:    Speech is normal; fluent and spontaneous with normal comprehension.  Cognition:     Orientation to time, place and person     Normal recent and remote memory     Normal Attention span and concentration     Normal Language, naming, repeating,spontaneous speech     Fund of knowledge   CRANIAL NERVES: CN II: Visual fields are full to confrontation. Pupils are round equal and briskly reactive  to light. CN III, IV, VI: extraocular movement are normal. No ptosis. CN V: Facial sensation is intact to pinprick in all 3 divisions bilaterally. Corneal responses are intact.  CN VII: Face is symmetric with normal eye closure and smile. CN VIII: Hearing is normal to rubbing fingers CN IX, X: Palate elevates symmetrically. Phonation is normal. CN XI: Head turning and shoulder shrug are intact CN XII: Tongue is midline with normal movements and no atrophy.  MOTOR: Manuel Sampson has mild bilateral shoulder abduction external rotation weakness, mild bilateral hand grip weakness, today Manuel Sampson was noted to have mild right hand resting tremor, rigidity with reinforcement maneuver, right more than left, Manuel Sampson has mild bilateral hip flexion weakness, moderate ankle dorsiflexion plantar flexion weakness, right worse than left, right ankle dorsiflexion 3, left 3 plus, plantar flexion right side was 4, left side was 4  REFLEXES: Reflexes are 1 and symmetric at the biceps, triceps, absent at knees, and ankles. Plantar  responses are flexor.  SENSORY: Length dependent decreased to light touch, pinprick and vibratory sensation to distal shin level.  COORDINATION: Rapid alternating movements and fine finger movements are intact. There is no dysmetria on finger-to-nose and heel-knee-shin.    GAIT/STANCE: Need to push up to get up from seated position, bilateral flailed gait, left worse than right  Positive Romberg signs   DIAGNOSTIC DATA (LABS, IMAGING, TESTING) - I reviewed patient records, labs, notes, testing and imaging myself where available.   ASSESSMENT AND PLAN  Lucile Bel is a 80 y.o. male   Chronic inflammatory demyelinating polyneuropathy  Manuel Sampson has significant bilateral foot drop, gait difficulty,    IVIG treatment since September 2013, Manuel Sampson continue to feel the persistent benefit  Continue 1g/kg (0.5 g/kgx2) q 4 weeks, (was on every 3 weeks before Nov 2017)  Repeat EMG nerve conduction study Neuropathic pain  Refilled his Cymbalta 60 mg daily Atrial fibrillation  Keep xelrato  Fall precaution  Marcial Pacas, M.D. Ph.D.  [facility] [address], [place], [place] [address] Ph: [phone] Fax: [phone]  CC: Referring Provider

## 2016-02-14 NOTE — Telephone Encounter (Signed)
[phone], Stephanie/[facility] called in to let the office know they have faxed in a form about pt's IVIG medication. She is requesting it be filled out. The pt is to have treatment on Monday.

## 2016-04-09 ENCOUNTER — Telehealth: Payer: Self-pay | Admitting: *Deleted

## 2016-04-09 NOTE — Telephone Encounter (Signed)
PA for Gamunex-C approved by [employer] [phone]) through 04/13/17 - pt [id number].

## 2016-04-18 ENCOUNTER — Encounter: Payer: Medicare Other | Admitting: Neurology

## 2016-11-14 ENCOUNTER — Ambulatory Visit (INDEPENDENT_AMBULATORY_CARE_PROVIDER_SITE_OTHER): Payer: Medicare Other | Admitting: Neurology

## 2016-11-14 DIAGNOSIS — G6181 Chronic inflammatory demyelinating polyneuritis: Secondary | ICD-10-CM

## 2016-11-14 DIAGNOSIS — R269 Unspecified abnormalities of gait and mobility: Secondary | ICD-10-CM

## 2016-11-14 MED ORDER — DULOXETINE HCL 60 MG PO CPEP: 60.0000 mg | ORAL_CAPSULE | Freq: Every day | ORAL | 4 refills | Status: DC

## 2016-11-14 NOTE — Progress Notes (Signed)
PATIENT: Manuel Sampson DOB: 04-08-1936  No chief complaint on file.    HISTORICAL  Manuel Sampson, seen in refer by    History of present illness: Manuel Sampson is a 81 years old right-handed Caucasian male   He was previously healthy, highly functional, was diagnosed with prostate cancer based on PSA elevation, had pelvic radiation since August 2012, lasting for 2 months, with seed implantation,  Around 2012, he first noticed bilateral feet numbness tingling burning achy pain, couple weeks later, he noticed gait difficulty, stumbling, right worse than left, symptoms continued to get worse, plateued within 6-7 months, he had a slight improvement over the past couple months, but still had significant gait difficulty,  He denies incontinence, no bilateral hands paresthesia, no fingertips numbness tingling, no bad weakness,  MRI of lumbar reviewed, from [facility], L4-5 right posterior lateral disc extrusion with downward turning, with right L5 encroachment, moderate bilateral foraminal stenosis  Electrodiagnostic study in November 18, 2011, showed prolonged F-wave latency of all the upper extremity motor nerves tested. Absence bilateral lower extremity motor and sensory responses, activity denervations involving bilateral distal leg muscles, consistent with demyelinating polyradiculoneuropathy   CSF showed elevated TP  72, glucose 79,  Labs, normal CMP, with exception of glucose 163, PEP,cbc, lyme titer, ssa,b, tsh,ace, ana, rpr, vit b12, crp,  He began to receive IVIG treatment from home health since Sep 2013,  1g/kg every 3 weeks, he tolerated the treatment very well, he reported significant improvement, he no longer walks with a cane, He go to church on Sunday without his ankle bracelet on sometimes. The numbness in his neck is now receding to his plantar feet only.  UPDATE June 26th 2014: He continues to show improvement with ivig, he is no longer staggering as much, walk with out  assistant, wearing bilateral ankle brace, he had infusion every 3 weeks, no significant side effect noticed.  UPDATE March 6th 2015: Overall, he does very well, work 8 hours each day, he was switched from [place] to [place] since February 2015. He completed two infusions, through home health agent [facility], last infusion was March second, and third, he complains of increased bilateral feet burning, numbness, especially at the ball of his feet, he continue to walk with a brace, which has been very helpful  He has been taking Cymbalta 60 mg every morning for a while, was not sure it is helping him, he denies significant side effect  He denies bilateral upper extremity symptoms, no incontinence  UPDATE Nov 11th 2015: He is receiving ivig 0.5 g/kg x2, every 3 weeks, He continue to show mild steady improvement over time, he is no longer wearing ankle brace, he now complains of low back pain, no bowel bladder incontinence  UPDATE Feb 14 2015: He no longer wear ankle brace, ambulate without assistance, complains of bilateral plantar surface pain, especially after prolonged walking In June 2016 he developed atrial fibrillation, went through cardiac conversion successfully, now he is taking anticoagulations Xelrato  He still receiving IVIG, 1 g/kg every 3 weeks  UPDATE Feb 14 2016: He continued to show mild improvement with continued IVIG treatment, the next 10% 1 g/kg (he weighed 86 kg,= use 90 g, divided into 2 doses every 3 weeks), he is getting home infusion, hope to change the schedule to every month,  He now ambulate without ankle brace, denies upper extremity involvement. He does have numbness, deep achy pain of bilateral foot. Cymbalta 60 mg has been helpful,  UPDATE November 14 2016: His overall doing very well, continued IVIG treatment 10%, 1g/kg divided into 2 days.  He complains some right hip pain, ambulate without ankle brace, still has significant gait abnormality,  REVIEW OF SYSTEMS:  Full 14 system review of systems performed and notable only for  gait difficulty  ALLERGIES: Allergies  Allergen Reactions  . Ezetimibe     Other reaction(s): Arthralgias (intolerance)  . Fenofibrate Micronized     Other reaction(s): Arthralgias (intolerance)  . Statins     Other reaction(s): Arthralgias (intolerance)    HOME MEDICATIONS: Current Outpatient Prescriptions  Medication Sig Dispense Refill  . amiodarone (PACERONE) 200 MG tablet Take 200 mg by mouth 2 (two) times daily.    . DULoxetine (CYMBALTA) 60 MG capsule Take 1 capsule (60 mg total) by mouth daily. 90 capsule 4  . GAMMAGARD 5 GM/50ML SOLN     . metFORMIN (GLUCOPHAGE) 500 MG tablet Take by mouth daily.    . metoprolol tartrate (LOPRESSOR) 25 MG tablet Take 25 mg by mouth daily.    . rivaroxaban (XARELTO) 20 MG TABS tablet Take 20 mg by mouth daily.     No current facility-administered medications for this visit.     PAST MEDICAL HISTORY: Past Medical History:  Diagnosis Date  . Abnormality of gait 07/07/2012  . CIDP (chronic inflammatory demyelinating polyneuropathy) ([place])   . CIDP (chronic inflammatory demyelinating polyneuropathy) ([place]) 07/07/2012  . Foot drop   . Gait abnormality   . Heart disease   . Irregular heartbeat   . Pre-diabetes   . Prostate cancer ([place])     PAST SURGICAL HISTORY: No past surgical history on file.  FAMILY HISTORY: No family history on file.  SOCIAL HISTORY:  Social History   Social History  . Marital status: Married    Spouse name: Webb Silversmith  . Number of children: 2  . Years of education: N/A   Occupational History  . Not on file.   Social History Main Topics  . Smoking status: Never Smoker  . Smokeless tobacco: Never Used  . Alcohol use No  . Drug use: No  . Sexual activity: Not on file   Other Topics Concern  . Not on file   Social History Narrative   Patient lives at home with his wife [name]). Patient is still working. Two children.Right handed.    Caffeine- two cups.           PHYSICAL EXAM   There were no vitals filed for this visit.  Not recorded      There is no height or weight on file to calculate BMI.  PHYSICAL EXAMNIATION:  Gen: NAD, conversant, well nourised, obese, well groomed                     Cardiovascular: Regular rate rhythm, no peripheral edema, warm, nontender. Eyes: Conjunctivae clear without exudates or hemorrhage Neck: Supple, no carotid bruise. Pulmonary: Clear to auscultation bilaterally   NEUROLOGICAL EXAM:  MENTAL STATUS: Speech:    Speech is normal; fluent and spontaneous with normal comprehension.  Cognition:     Orientation to time, place and person     Normal recent and remote memory     Normal Attention span and concentration     Normal Language, naming, repeating,spontaneous speech     Fund of knowledge   CRANIAL NERVES: CN II: Visual fields are full to confrontation. Pupils are round equal and briskly reactive to light. CN III, IV, VI: extraocular movement are normal.  No ptosis. CN V: Facial sensation is intact to pinprick in all 3 divisions bilaterally. Corneal responses are intact.  CN VII: Face is symmetric with normal eye closure and smile. CN VIII: Hearing is normal to rubbing fingers CN IX, X: Palate elevates symmetrically. Phonation is normal. CN XI: Head turning and shoulder shrug are intact CN XII: Tongue is midline with normal movements and no atrophy.  MOTOR: He has mild bilateral shoulder abduction external rotation weakness, mild bilateral hand grip weakness, today he was noted to have mild right hand resting tremor, rigidity with reinforcement maneuver, right more than left, he has mild bilateral hip flexion weakness, moderate ankle dorsiflexion plantar flexion weakness, right worse than left, right ankle dorsiflexion 3, left 3 plus, plantar flexion right side was 4, left side was 4  REFLEXES: Reflexes are 1 and symmetric at the biceps, triceps, absent at knees,  and ankles. Plantar responses are flexor.  SENSORY: Length dependent decreased to light touch, pinprick and vibratory sensation to distal shin level.  COORDINATION: Rapid alternating movements and fine finger movements are intact. There is no dysmetria on finger-to-nose and heel-knee-shin.    GAIT/STANCE: Need to push up to get up from seated position, bilateral flailed gait, left worse than right  Positive Romberg signs   DIAGNOSTIC DATA (LABS, IMAGING, TESTING) - I reviewed patient records, labs, notes, testing and imaging myself where available.   ASSESSMENT AND PLAN  Manuel Sampson is a 80 y.o. male   Chronic inflammatory demyelinating polyneuropathy  He has significant bilateral foot drop, gait difficulty,    IVIG treatment since September 2013, he continue to feel the persistent benefit  Continue 1g/kg (0.5 g/kgx2) q 4 weeks, (was on every 3 weeks before Nov 2017)  Repeat EMG nerve conduction study today November 14 2016 showed no significant change compared to previous study in April 2015  Neuropathic pain  Continue Cymbalta 60 mg daily Atrial fibrillation  Keep xelrato  Fall precaution  I have advised him to continue ankle brace  Marcial Pacas, M.D. Ph.D.  [facility] [address] [place], [place] [address] Ph: [phone] Fax: [phone]  CC: Referring Provider

## 2016-11-14 NOTE — Procedures (Signed)
Full Name: Manuel Sampson Gender: Male MRN #: [id number] Date of Birth: 15-Jun-2035    Visit Date: 11/14/16 08:59 Age: 81 Years 65 Months Old Examining Physician: Marcial Pacas, MD  Referring Physician: Krista Blue, MD History: 81 years old male with history of CIDP, has been receiving IVIG treatment every 3 weeks since November 2017, he continues show mild improvement.  Summary of the test:  Nerve conduction study: Bilateral sural, superficial peroneal sensory responses were absent. Bilateral peroneal to EDB and tibial motor responses were absent. Right median ulnar sensory responses were absent.  Right ulnar motor responses showed mildly prolonged distal latency, with mildly slow conduction velocity 35 m/s. Right median motor responses showed severely prolonged distal latency, with mildly decreased C map amplitude, with moderately slow conduction velocity 32 m/s. Right median and ulnar F wave latency was severely prolonged.  Electromyography: Selective needle examinations were performed at bilateral lower extremity muscles, bilateral lumbar paraspinal muscles; right upper extremity muscle, and right cervical paraspinal muscles.  There is evidence of chronic mild neuropathic changes involving bilateral lumbar sacral myotomes, bilateral L4-5 S1 myotomes. There is no evidence of active process. There is also evidence of mild chronic changes involving right first dorsal interossei, abductor pollicis brevis. There is no evidence of spontaneous activity at right cervical paraspinal muscles.  Conclusion: This is an abnormal study. There is continued evidence of chronic demyelinating polyradiculoneuropathy with superimposed right median neuropathy. There is no significant axonal damage. In comparison to previous study in April 2015, there is no significant change.    ------------------------------- Marcial Pacas M.D.  [facility] [place], [place] [address] Tel:  [phone] Fax: [phone]        [facility]    Nerve / Sites Muscle Latency Ref. Amplitude Ref. Rel Amp Segments Distance Velocity Ref. Area    ms ms mV mV %  cm m/s m/s mVms  R Median - APB     Wrist APB 8.3 ?4.4 3.2 ?4.0 100 Wrist - APB 7   9.5     Upper arm APB 15.8  2.5  77.7 Upper arm - Wrist 24 32 ?49 6.9  R Ulnar - ADM     Wrist ADM 4.4 ?3.3 7.7 ?6.0 100 Wrist - ADM 7   24.1     B.Elbow ADM 10.2  6.1  80.2 B.Elbow - Wrist 20 35 ?49 20.7     A.Elbow ADM 13.1  5.3  85.6 A.Elbow - B.Elbow 10 34 ?49 20.6         A.Elbow - Wrist      R Peroneal - EDB     Ankle EDB NR ?6.5 NR ?2.0 NR Ankle - EDB 9   NR         Pop fossa - Ankle      L Peroneal - EDB     Ankle EDB NR ?6.5 NR ?2.0 NR Ankle - EDB 9   NR         Pop fossa - Ankle      R Tibial - AH     Ankle AH NR ?5.8 NR ?4.0 NR Ankle - AH 9   NR  L Tibial - AH     Ankle AH NR ?5.8 NR ?4.0 NR Ankle - AH 9   NR                 SNC    Nerve / Sites Rec. Site Peak Lat Ref.  Amp Ref.  Segments Distance    ms ms V V  cm  R Radial - Anatomical snuff box (Forearm)     Forearm Wrist 4.5 ?2.9 5 ?15 Forearm - Wrist 10  R Sural - Ankle (Calf)     Calf Ankle NR ?4.4 NR ?6 Calf - Ankle 14  L Sural - Ankle (Calf)     Calf Ankle NR ?4.4 NR ?6 Calf - Ankle 14  L Superficial peroneal - Ankle     Lat leg Ankle NR ?4.4 NR ?6 Lat leg - Ankle 14  R Superficial peroneal - Ankle     Lat leg Ankle NR ?4.4 NR ?6 Lat leg - Ankle 14  R Median - Orthodromic (Dig II, Mid palm)     Dig II Wrist NR ?3.4 NR ?10 Dig II - Wrist 13  R Ulnar - Orthodromic, (Dig V, Mid palm)     Dig V Wrist NR ?3.1 NR ?5 Dig V - Wrist 21                   F  Wave    Nerve F Lat Ref.   ms ms  R Median - APB 43.1 ?31.0  R Ulnar - ADM 42.2 ?32.0         EMG full       EMG Summary Table    Spontaneous MUAP Recruitment  Muscle IA Fib PSW Fasc Other Amp Dur. Poly Pattern  R. Tibialis anterior Increased 1+ None None _______ Normal Normal Normal Reduced  R. Tibialis  posterior Increased None None None _______ Normal Normal Normal Reduced  R. Vastus lateralis Normal None None None _______ Normal Normal Normal Reduced  L. Tibialis anterior Increased 1+ None None _______ Normal Normal Normal Discrete  L. Gastrocnemius (Medial head) Increased None None None _______ Normal Normal Normal Reduced  L. Vastus lateralis Normal None None None _______ Normal Normal Normal Reduced  L. Lumbar paraspinals (mid) Normal None None None _______ Normal Normal Normal Normal  L. Lumbar paraspinals (low) Normal None None None _______ Normal Normal Normal Normal  R. Lumbar paraspinals (mid) Normal None None None _______ Normal Normal Normal Normal  R. Lumbar paraspinals (low) Normal None None None _______ Normal Normal Normal Normal  R. First dorsal interosseous Normal None None None _______ Normal Normal Normal Reduced  R. Abductor pollicis brevis Normal None None None _______ Normal Normal Normal Reduced  R. Pronator teres Normal None None None _______ Normal Normal Normal Normal  R. Biceps brachii Normal None None None _______ Normal Normal Normal Normal  R. Deltoid Normal None None None _______ Normal Normal Normal Normal  R. Cervical paraspinals Normal None None None _______ Normal Normal Normal Normal

## 2016-11-28 ENCOUNTER — Encounter: Payer: Medicare Other | Admitting: Neurology

## 2017-01-26 ENCOUNTER — Encounter: Payer: Self-pay | Admitting: *Deleted

## 2017-01-26 ENCOUNTER — Telehealth: Payer: Self-pay | Admitting: Neurology

## 2017-01-26 NOTE — Telephone Encounter (Signed)
Manuel Sampson with [facility] is calling to get new orders for IVIG.

## 2017-01-26 NOTE — Telephone Encounter (Signed)
Returned call to Vania Rea - she was unavailable to speak with me.  Signed orders have been faxed and confirmed to [facility] at [phone].

## 2017-01-26 NOTE — Telephone Encounter (Signed)
Dr. Krista Blue has reviewed patient's chart.  Per vo by Dr. Krista Blue, patient should continue his current dosage of IVIG Gamunex-C 10% at 1g/kg every month.

## 2017-02-24 ENCOUNTER — Encounter: Payer: Self-pay | Admitting: Neurology

## 2017-02-24 ENCOUNTER — Ambulatory Visit: Payer: Medicare Other | Admitting: Neurology

## 2017-02-24 VITALS — BP 169/77 | HR 61 | Ht 69.0 in | Wt 197.0 lb

## 2017-02-24 DIAGNOSIS — G6181 Chronic inflammatory demyelinating polyneuritis: Secondary | ICD-10-CM

## 2017-02-24 DIAGNOSIS — R259 Unspecified abnormal involuntary movements: Secondary | ICD-10-CM | POA: Insufficient documentation

## 2017-02-24 DIAGNOSIS — R269 Unspecified abnormalities of gait and mobility: Secondary | ICD-10-CM | POA: Insufficient documentation

## 2017-02-24 NOTE — Progress Notes (Signed)
PATIENT: Manuel Sampson DOB: 01-Dec-1935  Chief Complaint  Patient presents with  . CIDP    He is here for his yearly follow up.  No new concerns today.  He is doing well on IVIG.  Marland Kitchen Atrial Fibrillation    He has continued taking Xarelto daily.  . Peripheral Neuropathy    Feels duloxetine is helpful for his pain.  The numbness causes him some gait difficulty.     HISTORICAL  Manuel Sampson, seen in refer by    History of present illness: Manuel Sampson is a 81 years old right-handed Caucasian male   He was previously healthy, highly functional, was diagnosed with prostate cancer based on PSA elevation, had pelvic radiation since August 2012, lasting for 2 months, with seed implantation,  Around 2012, he first noticed bilateral feet numbness tingling burning achy pain, couple weeks later, he noticed gait difficulty, stumbling, right worse than left, symptoms continued to get worse, plateued within 6-7 months, he had a slight improvement over the past couple months, but still had significant gait difficulty,  He denies incontinence, no bilateral hands paresthesia, no fingertips numbness tingling, no bad weakness,  MRI of lumbar reviewed, from [facility], L4-5 right posterior lateral disc extrusion with downward turning, with right L5 encroachment, moderate bilateral foraminal stenosis  Electrodiagnostic study in November 18, 2011, showed prolonged F-wave latency of all the upper extremity motor nerves tested. Absence bilateral lower extremity motor and sensory responses, activity denervations involving bilateral distal leg muscles, consistent with demyelinating polyradiculoneuropathy   CSF showed elevated TP  72, glucose 79,  Labs, normal CMP, with exception of glucose 163, PEP,cbc, lyme titer, ssa,b, tsh,ace, ana, rpr, vit b12, crp,  He began to receive IVIG treatment from home health since 81 Sep 2013,  1g/kg every 3 weeks, he tolerated the treatment very well, he reported  significant improvement, he no longer walks with a cane, He go to church on Sunday without his ankle bracelet on sometimes. The numbness in his neck is now receding to his plantar feet only.  UPDATE June 26th 2014: He continues to show improvement with ivig, he is no longer staggering as much, walk with out assistant, wearing bilateral ankle brace, he had infusion every 3 weeks, no significant side effect noticed.  UPDATE March 6th 2015: Overall, he does very well, work 8 hours each day, he was switched from [place] to [place] since February 2015. He completed two infusions, through home health agent [facility], last infusion was March second, and third, he complains of increased bilateral feet burning, numbness, especially at the ball of his feet, he continue to walk with a brace, which has been very helpful  He has been taking Cymbalta 60 mg every morning for a while, was not sure it is helping him, he denies significant side effect  He denies bilateral upper extremity symptoms, no incontinence  UPDATE Nov 11th 2015: He is receiving ivig 0.5 g/kg x2, every 3 weeks, He continue to show mild steady improvement over time, he is no longer wearing ankle brace, he now complains of low back pain, no bowel bladder incontinence  UPDATE Feb 14 2015: He no longer wear ankle brace, ambulate without assistance, complains of bilateral plantar surface pain, especially after prolonged walking In June 2016 he developed atrial fibrillation, went through cardiac conversion successfully, now he is taking anticoagulations Xelrato  He still receiving IVIG, 1 g/kg every 3 weeks  UPDATE Feb 14 2016: He continued to show mild improvement with  continued IVIG treatment, the next 10% 1 g/kg (he weighed 86 kg,= use 90 g, divided into 2 doses every 3 weeks), he is getting home infusion, hope to change the schedule to every month,  He now ambulate without ankle brace, denies upper extremity involvement. He does have  numbness, deep achy pain of bilateral foot. Cymbalta 60 mg has been helpful,  UPDATE November 14 2016: His overall doing very well, continued IVIG treatment 10%, 1g/kg divided into 2 days.  He complains some right hip pain, ambulate without ankle brace, still has significant gait abnormality,  UPDATE Feb 24 2017: He is still getting IVIG 10%, 1 g/kg every month, doing very well, continued to work full-time, he does has mild gait abnormality, dragging his right leg more,  He denied loss of sense of smell, sleeps well,  REVIEW OF SYSTEMS: Full 14 system review of systems performed and notable only for  gait difficulty  ALLERGIES: Allergies  Allergen Reactions  . Ezetimibe     Other reaction(s): Arthralgias (intolerance)  . Fenofibrate Micronized     Other reaction(s): Arthralgias (intolerance)  . Statins     Other reaction(s): Arthralgias (intolerance)    HOME MEDICATIONS: Current Outpatient Medications  Medication Sig Dispense Refill  . amiodarone (PACERONE) 200 MG tablet Take 200 mg by mouth 2 (two) times daily.    . DULoxetine (CYMBALTA) 60 MG capsule Take 1 capsule (60 mg total) by mouth daily. 90 capsule 4  . Immune Globulin 10% (GAMUNEX-C) 10 GM/100ML SOLN Inject 1 g/kg into the vein every 30 (thirty) days.    . metFORMIN (GLUCOPHAGE) 500 MG tablet Take by mouth daily.    . metoprolol tartrate (LOPRESSOR) 25 MG tablet Take 25 mg by mouth daily.    . rivaroxaban (XARELTO) 20 MG TABS tablet Take 20 mg by mouth daily.     No current facility-administered medications for this visit.     PAST MEDICAL HISTORY: Past Medical History:  Diagnosis Date  . Abnormality of gait 07/07/2012  . CIDP (chronic inflammatory demyelinating polyneuropathy) ([place])   . CIDP (chronic inflammatory demyelinating polyneuropathy) ([place]) 07/07/2012  . Foot drop   . Gait abnormality   . Heart disease   . Irregular heartbeat   . Pre-diabetes   . Prostate cancer ([place])     PAST SURGICAL  HISTORY: History reviewed. No pertinent surgical history.  FAMILY HISTORY: History reviewed. No pertinent family history.  SOCIAL HISTORY:  Social History   Socioeconomic History  . Marital status: Married    Spouse name: Webb Silversmith  . Number of children: 2  . Years of education: Not on file  . Highest education level: Not on file  Social Needs  . Financial resource strain: Not on file  . Food insecurity - worry: Not on file  . Food insecurity - inability: Not on file  . Transportation needs - medical: Not on file  . Transportation needs - non-medical: Not on file  Occupational History  . Not on file  Tobacco Use  . Smoking status: Never Smoker  . Smokeless tobacco: Never Used  Substance and Sexual Activity  . Alcohol use: No    Alcohol/week: 0.0 oz  . Drug use: No  . Sexual activity: Not on file  Other Topics Concern  . Not on file  Social History Narrative   Patient lives at home with his wife [name]). Patient is still working. Two children.Right handed.   Caffeine- two cups.        PHYSICAL EXAM  Vitals:   02/24/17 1058  BP: (!) 169/77  Pulse: 61  Weight: 197 lb (89.4 kg)  Height: 5\' 9"  (1.753 m)    Not recorded      Body mass index is 29.09 kg/m.  PHYSICAL EXAMNIATION:  Gen: NAD, conversant, well nourised, obese, well groomed                     Cardiovascular: Regular rate rhythm, no peripheral edema, warm, nontender. Eyes: Conjunctivae clear without exudates or hemorrhage Neck: Supple, no carotid bruise. Pulmonary: Clear to auscultation bilaterally   NEUROLOGICAL EXAM:  MENTAL STATUS: Speech:    Speech is normal; fluent and spontaneous with normal comprehension.  Cognition:     Orientation to time, place and person     Normal recent and remote memory     Normal Attention span and concentration     Normal Language, naming, repeating,spontaneous speech     Fund of knowledge   CRANIAL NERVES: CN II: Visual fields are full to confrontation.  Pupils are round equal and briskly reactive to light. CN III, IV, VI: extraocular movement are normal. No ptosis. CN V: Facial sensation is intact to pinprick in all 3 divisions bilaterally. Corneal responses are intact.  CN VII: Face is symmetric with normal eye closure and smile. CN VIII: Hearing is normal to rubbing fingers CN IX, X: Palate elevates symmetrically. Phonation is normal. CN XI: Head turning and shoulder shrug are intact CN XII: Tongue is midline with normal movements and no atrophy.  MOTOR: He has mild bilateral shoulder abduction external rotation weakness, mild bilateral hand grip weakness, today he was noted to have mild right hand resting tremor, rigidity with reinforcement maneuver, right more than left, he has mild bilateral hip flexion weakness, moderate ankle dorsiflexion plantar flexion weakness, right worse than left, right ankle dorsiflexion 3, left 3 plus, plantar flexion right side was 4, left side was 4  REFLEXES: Reflexes are 1 and symmetric at the biceps, triceps, absent at knees, and ankles. Plantar responses are flexor.  SENSORY: Length dependent decreased to light touch, pinprick and vibratory sensation to distal shin level.  COORDINATION: Rapid alternating movements and fine finger movements are intact. There is no dysmetria on finger-to-nose and heel-knee-shin.    GAIT/STANCE: Need to push up to get up from seated position, bilateral flailed gait, left worse than right  Positive Romberg signs   DIAGNOSTIC DATA (LABS, IMAGING, TESTING) - I reviewed patient records, labs, notes, testing and imaging myself where available.   ASSESSMENT AND PLAN  Manuel Sampson is a 81 y.o. male   Chronic inflammatory demyelinating polyneuropathy  He has significant bilateral foot drop, gait difficulty,    IVIG treatment since September 2013, he continue to feel the persistent benefit  Continue 1g/kg (0.5 g/kgx2) q 4 weeks, (was on every 3 weeks before Nov  2017)  Repeat EMG nerve conduction study today November 14 2016 showed no significant change compared to previous study in April 2015  Neuropathic pain  Continue Cymbalta 60 mg daily  Atrial fibrillation  Keep xelrato  Fall precaution  I have advised him to continue ankle brace  New-onset parkinsonian features  Right hand resting tremor, rigidity, mild bradykinesia, mild nuchal rigidity, worsening gait abnormality  I have suggested MRI of the brain, physical therapy, Sinemet, she wants to hold it off right now  Marcial Pacas, M.D. Ph.D.  [facility] [address], [place], [place] [address] Ph: [phone] Fax: [phone]  CC:  Referring Provider

## 2017-03-11 ENCOUNTER — Telehealth: Payer: Self-pay | Admitting: Neurology

## 2017-03-11 NOTE — Telephone Encounter (Signed)
Tiffany with [facility] needs recent labs and office notes faxed to [phone]

## 2017-03-11 NOTE — Telephone Encounter (Signed)
Most recent office visit faxed to [facility].  No recent lab work completed at our office.

## 2017-03-18 ENCOUNTER — Telehealth: Payer: Self-pay | Admitting: Neurology

## 2017-03-18 NOTE — Telephone Encounter (Signed)
Patient's wife is calling to get a Rx called to [facility] [address] in [place] as discussed at last office visit for Parkinson's.

## 2017-03-18 NOTE — Telephone Encounter (Addendum)
Office noted from 02/24/17:  New-onset parkinsonian features             Right hand resting tremor, rigidity, mild bradykinesia, mild nuchal rigidity, worsening gait abnormality.             I have suggested MRI of the brain, physical therapy, Sinemet, he wants to hold it off right now. _______________________________________________________________________________________ Damaris Schooner to patient's wife - says they have discussed his symptoms and he would like to try Sinemet.  She is aware we will call her back if further information is needed.  Otherwise, she should check with the pharmacy tomorrow afternoon.  She is agreeable to this plan.

## 2017-03-19 MED ORDER — CARBIDOPA-LEVODOPA 25-100 MG PO TABS: 1.0000 | ORAL_TABLET | Freq: Three times a day (TID) | ORAL | 6 refills | Status: DC

## 2017-03-19 NOTE — Addendum Note (Signed)
Addended by: Marcial Pacas on: 03/19/2017 09:01 AM   Modules accepted: Orders

## 2017-03-19 NOTE — Telephone Encounter (Signed)
LMOM to let pt. and wife know [facility] was [facility]; advised of common side effects; they should call if they have questions/concerns/fim

## 2017-03-19 NOTE — Telephone Encounter (Signed)
I wrote Sinemet 25/100 tid.  Common side effects are GI upset, orthostatic hypotension, sleepiness,  He may take it after meal,

## 2017-03-30 ENCOUNTER — Telehealth: Payer: Self-pay | Admitting: Neurology

## 2017-03-30 NOTE — Telephone Encounter (Signed)
Manuel Sampson with [facility] is calling regarding IVIIG order.

## 2017-03-31 ENCOUNTER — Telehealth: Payer: Self-pay | Admitting: *Deleted

## 2017-03-31 NOTE — Telephone Encounter (Signed)
IVIG approved by [employer]/Medicare Complete [facility]). Authorization valid from 04/14/2017 through 04/13/2018 (Auth#[id number], case# [id number], member ID# [id number]).

## 2017-04-01 NOTE — Telephone Encounter (Addendum)
Jenny Reichmann with [facility] called from patient's home but did not provide an alternate number. Received IVIG Post Infusion Report and provided to Dr. Krista Blue for review. Reports patient tolerated IVIG well.

## 2017-05-18 ENCOUNTER — Encounter: Payer: Self-pay | Admitting: Neurology

## 2017-05-18 ENCOUNTER — Ambulatory Visit: Payer: Medicare Other | Admitting: Neurology

## 2017-05-18 VITALS — BP 157/75 | HR 68 | Ht 69.0 in | Wt 197.5 lb

## 2017-05-18 DIAGNOSIS — R269 Unspecified abnormalities of gait and mobility: Secondary | ICD-10-CM

## 2017-05-18 DIAGNOSIS — R259 Unspecified abnormal involuntary movements: Secondary | ICD-10-CM

## 2017-05-18 DIAGNOSIS — G6181 Chronic inflammatory demyelinating polyneuritis: Secondary | ICD-10-CM | POA: Diagnosis not present

## 2017-05-18 MED ORDER — CARBIDOPA-LEVODOPA 25-100 MG PO TABS: 1.0000 | ORAL_TABLET | Freq: Three times a day (TID) | ORAL | 6 refills | Status: DC

## 2017-05-18 NOTE — Progress Notes (Signed)
PATIENT: Manuel Sampson DOB: 06-14-1935  Chief Complaint  Patient presents with  . CIDP    He is here with his son, Manuel Sampson.  He has continued with IVIG.  His gait is slow and unsteady.   . Peripheral Neuropathy    Feels symptoms are controlled with duloxetine 60mg  daily.  . Tremors    He uses Sinemet 25-100mg , one tablet three times daily.  His tremor is mildly better.     HISTORICAL  Manuel Sampson, seen in refer by    History of present illness: Manuel Sampson is a 82 years old right-handed Caucasian male   He was previously healthy, highly functional, was diagnosed with prostate cancer based on PSA elevation, had pelvic radiation since August 2012, lasting for 2 months, with seed implantation,  Around 2012, he first noticed bilateral feet numbness tingling burning achy pain, couple weeks later, he noticed gait difficulty, stumbling, right worse than left, symptoms continued to get worse, plateued within 6-7 months, he had a slight improvement over the past couple months, but still had significant gait difficulty,  He denies incontinence, no bilateral hands paresthesia, no fingertips numbness tingling, no bad weakness,  MRI of lumbar reviewed, from [facility], L4-5 right posterior lateral disc extrusion with downward turning, with right L5 encroachment, moderate bilateral foraminal stenosis  Electrodiagnostic study in November 18, 2011, showed prolonged F-wave latency of all the upper extremity motor nerves tested. Absence bilateral lower extremity motor and sensory responses, activity denervations involving bilateral distal leg muscles, consistent with demyelinating polyradiculoneuropathy   CSF showed elevated TP  72, glucose 79,  Labs, normal CMP, with exception of glucose 163, PEP,cbc, lyme titer, ssa,b, tsh,ace, ana, rpr, vit b12, crp,  He began to receive IVIG treatment from home health since Sep 2013,  1g/kg every 3 weeks, he tolerated the treatment very well, he reported  significant improvement, he no longer walks with a cane, He go to church on Sunday without his ankle bracelet on sometimes. The numbness in his neck is now receding to his plantar feet only.  UPDATE June 26th 2014: He continues to show improvement with ivig, he is no longer staggering as much, walk with out assistant, wearing bilateral ankle brace, he had infusion every 3 weeks, no significant side effect noticed.  UPDATE March 6th 2015: Overall, he does very well, work 8 hours each day, he was switched from [place] to [place] since February 2015. He completed two infusions, through home health agent [facility], last infusion was March second, and third, he complains of increased bilateral feet burning, numbness, especially at the ball of his feet, he continue to walk with a brace, which has been very helpful  He has been taking Cymbalta 60 mg every morning for a while, was not sure it is helping him, he denies significant side effect  He denies bilateral upper extremity symptoms, no incontinence  UPDATE Nov 11th 2015: He is receiving ivig 0.5 g/kg x2, every 3 weeks, He continue to show mild steady improvement over time, he is no longer wearing ankle brace, he now complains of low back pain, no bowel bladder incontinence  UPDATE Feb 14 2015: He no longer wear ankle brace, ambulate without assistance, complains of bilateral plantar surface pain, especially after prolonged walking In June 2016 he developed atrial fibrillation, went through cardiac conversion successfully, now he is taking anticoagulations Xelrato  He still receiving IVIG, 1 g/kg every 3 weeks  UPDATE Feb 14 2016: He continued to show mild  improvement with continued IVIG treatment, the next 10% 1 g/kg (he weighed 86 kg,= use 90 g, divided into 2 doses every 3 weeks), he is getting home infusion, hope to change the schedule to every month,  He now ambulate without ankle brace, denies upper extremity involvement. He does have  numbness, deep achy pain of bilateral foot. Cymbalta 60 mg has been helpful,  UPDATE November 14 2016: His overall doing very well, continued IVIG treatment 10%, 1g/kg divided into 2 days.  He complains some right hip pain, ambulate without ankle brace, still has significant gait abnormality,  UPDATE Feb 24 2017: He is still getting IVIG 10%, 1 g/kg every month, doing very well, continued to work full-time, he does has mild gait abnormality, dragging his right leg more,  He denied loss of sense of smell, sleeps well,  UPDATE May 18 2017: He is accompanied by his son at visit, he is over all doing well, sinemet 25/100 tid has been helpful,  He reported 50% improvement.   REVIEW OF SYSTEMS: Full 14 system review of systems performed and notable only for  gait difficulty  ALLERGIES: Allergies  Allergen Reactions  . Ezetimibe     Other reaction(s): Arthralgias (intolerance)  . Fenofibrate Micronized     Other reaction(s): Arthralgias (intolerance)  . Statins     Other reaction(s): Arthralgias (intolerance)    HOME MEDICATIONS: Current Outpatient Medications  Medication Sig Dispense Refill  . amiodarone (PACERONE) 200 MG tablet Take 200 mg by mouth 2 (two) times daily.    . carbidopa-levodopa (SINEMET IR) 25-100 MG tablet Take 1 tablet by mouth 3 (three) times daily. 90 tablet 6  . DULoxetine (CYMBALTA) 60 MG capsule Take 1 capsule (60 mg total) by mouth daily. 90 capsule 4  . Immune Globulin 10% (GAMUNEX-C) 10 GM/100ML SOLN Inject 1 g/kg into the vein every 30 (thirty) days.    . metFORMIN (GLUCOPHAGE) 500 MG tablet Take by mouth daily.    . metoprolol tartrate (LOPRESSOR) 25 MG tablet Take 25 mg by mouth daily.    . rivaroxaban (XARELTO) 20 MG TABS tablet Take 20 mg by mouth daily.     No current facility-administered medications for this visit.     PAST MEDICAL HISTORY: Past Medical History:  Diagnosis Date  . Abnormality of gait 07/07/2012  . CIDP (chronic inflammatory  demyelinating polyneuropathy) ([place])   . CIDP (chronic inflammatory demyelinating polyneuropathy) ([place]) 07/07/2012  . Foot drop   . Gait abnormality   . Heart disease   . Irregular heartbeat   . Pre-diabetes   . Prostate cancer ([place])     PAST SURGICAL HISTORY: History reviewed. No pertinent surgical history.  FAMILY HISTORY: History reviewed. No pertinent family history.  SOCIAL HISTORY:  Social History   Socioeconomic History  . Marital status: Married    Spouse name: Webb Silversmith  . Number of children: 2  . Years of education: Not on file  . Highest education level: Not on file  Social Needs  . Financial resource strain: Not on file  . Food insecurity - worry: Not on file  . Food insecurity - inability: Not on file  . Transportation needs - medical: Not on file  . Transportation needs - non-medical: Not on file  Occupational History  . Not on file  Tobacco Use  . Smoking status: Never Smoker  . Smokeless tobacco: Never Used  Substance and Sexual Activity  . Alcohol use: No    Alcohol/week: 0.0 oz  . Drug use: No  .  Sexual activity: Not on file  Other Topics Concern  . Not on file  Social History Narrative   Patient lives at home with his wife [name]). Patient is still working. Two children.Right handed.   Caffeine- two cups.        PHYSICAL EXAM   Vitals:   05/18/17 1111  BP: (!) 157/75  Pulse: 68  Weight: 197 lb 8 oz (89.6 kg)  Height: 5\' 9"  (1.753 m)    Not recorded      Body mass index is 29.17 kg/m.  PHYSICAL EXAMNIATION:  Gen: NAD, conversant, well nourised, obese, well groomed                     Cardiovascular: Regular rate rhythm, no peripheral edema, warm, nontender. Eyes: Conjunctivae clear without exudates or hemorrhage Neck: Supple, no carotid bruise. Pulmonary: Clear to auscultation bilaterally   NEUROLOGICAL EXAM:  MENTAL STATUS: Speech:    Speech is normal; fluent and spontaneous with normal comprehension.  Cognition:      Orientation to time, place and person     Normal recent and remote memory     Normal Attention span and concentration     Normal Language, naming, repeating,spontaneous speech     Fund of knowledge   CRANIAL NERVES: CN II: Visual fields are full to confrontation. Pupils are round equal and briskly reactive to light. CN III, IV, VI: extraocular movement are normal. No ptosis. CN V: Facial sensation is intact to pinprick in all 3 divisions bilaterally. Corneal responses are intact.  CN VII: Face is symmetric with normal eye closure and smile. CN VIII: Hearing is normal to rubbing fingers CN IX, X: Palate elevates symmetrically. Phonation is normal. CN XI: Head turning and shoulder shrug are intact CN XII: Tongue is midline with normal movements and no atrophy.  MOTOR: He has mild right hand resting tremor, rigidity with reinforcement maneuver, right more than left, he has mild bilateral hip flexion weakness, moderate ankle dorsiflexion plantar flexion weakness, right worse than left, right ankle dorsiflexion 3, left 3 plus, plantar flexion right side was 4, left side was 4  REFLEXES: Reflexes are 1 and symmetric at the biceps, triceps, absent at knees, and ankles. Plantar responses are flexor.  SENSORY: Length dependent decreased to light touch, pinprick and vibratory sensation to distal shin level.  COORDINATION: Rapid alternating movements and fine finger movements are intact. There is no dysmetria on finger-to-nose and heel-knee-shin.    GAIT/STANCE: Need to push up to get up from seated position, bilateral flailed gait, left worse than right  Positive Romberg signs   DIAGNOSTIC DATA (LABS, IMAGING, TESTING) - I reviewed patient records, labs, notes, testing and imaging myself where available.   ASSESSMENT AND PLAN  Manuel Sampson is a 82 y.o. male   Chronic inflammatory demyelinating polyneuropathy  He has significant bilateral foot drop, gait difficulty,    IVIG treatment  since September 2013, he continue to feel the persistent benefit  Continue 1g/kg (0.5 g/kgx2) q 4 weeks, (was on every 3 weeks before Nov 2017)  Repeat EMG nerve conduction study on November 14 2016 showed no significant change compared to previous study in April 2015, continue with evidence of chronic demyelinating poly-radicular neuropathy, with superimposed right median neuropathy.  There is no significant axonal loss based on EMG findings.  Neuropathic pain  Continue Cymbalta 60 mg daily  Atrial fibrillation  Keep xelrato  Fall precaution   New-onset parkinsonian features  Right hand resting tremor, rigidity,  mild bradykinesia, mild nuchal rigidity, worsening gait abnormality  Did well with Sinemet 25/100 tid.  Marcial Pacas, M.D. Ph.D.  [facility] [address], [place], [place] [address] Ph: [phone] Fax: [phone]  CC: Referring Provider

## 2017-08-25 ENCOUNTER — Ambulatory Visit: Payer: Medicare Other | Admitting: Neurology

## 2017-11-12 ENCOUNTER — Ambulatory Visit: Payer: Medicare Other | Admitting: Neurology

## 2017-11-12 ENCOUNTER — Encounter: Payer: Self-pay | Admitting: Neurology

## 2017-11-12 VITALS — BP 144/70 | HR 62 | Ht 69.0 in | Wt 190.0 lb

## 2017-11-12 DIAGNOSIS — R269 Unspecified abnormalities of gait and mobility: Secondary | ICD-10-CM

## 2017-11-12 DIAGNOSIS — R259 Unspecified abnormal involuntary movements: Secondary | ICD-10-CM | POA: Diagnosis not present

## 2017-11-12 DIAGNOSIS — G6181 Chronic inflammatory demyelinating polyneuritis: Secondary | ICD-10-CM | POA: Diagnosis not present

## 2017-11-12 DIAGNOSIS — M792 Neuralgia and neuritis, unspecified: Secondary | ICD-10-CM

## 2017-11-12 MED ORDER — CARBIDOPA-LEVODOPA 25-100 MG PO TABS: 1.0000 | ORAL_TABLET | Freq: Three times a day (TID) | ORAL | 6 refills | Status: DC

## 2017-11-12 MED ORDER — DULOXETINE HCL 60 MG PO CPEP: 60.0000 mg | ORAL_CAPSULE | Freq: Every day | ORAL | 4 refills | Status: DC

## 2017-11-12 NOTE — Progress Notes (Signed)
PATIENT: Darean Rote DOB: 19-Oct-1935  Chief Complaint  Patient presents with  . CIDP    Patient reports IVIG is going well and he can see benefits from the infuison   . Peripheral Neuropathy    Duloxetine is helping with neuropathy  . Tremors    Sinemet is helping some but cannot see much of a change in his tremor.     HISTORICAL  Aloysius Heinle, seen in refer by    History of present illness: Mr.Cayson is a 82 years old right-handed Caucasian male   He was previously healthy, highly functional, was diagnosed with prostate cancer based on PSA elevation, had pelvic radiation since August 2012, lasting for 2 months, with seed implantation,  Around 2012, he first noticed bilateral feet numbness tingling burning achy pain, couple weeks later, he noticed gait difficulty, stumbling, right worse than left, symptoms continued to get worse, plateued within 6-7 months, he had a slight improvement over the past couple months, but still had significant gait difficulty,  He denies incontinence, no bilateral hands paresthesia, no fingertips numbness tingling, no bad weakness,  MRI of lumbar reviewed, from [facility], L4-5 right posterior lateral disc extrusion with downward turning, with right L5 encroachment, moderate bilateral foraminal stenosis  Electrodiagnostic study in November 18, 2011, showed prolonged F-wave latency of all the upper extremity motor nerves tested. Absence bilateral lower extremity motor and sensory responses, activity denervations involving bilateral distal leg muscles, consistent with demyelinating polyradiculoneuropathy   CSF showed elevated TP  72, glucose 79,  Labs, normal CMP, with exception of glucose 163, PEP,cbc, lyme titer, ssa,b, tsh,ace, ana, rpr, vit b12, crp,  He began to receive IVIG treatment from home health since Sep 2013,  1g/kg every 3 weeks, he tolerated the treatment very well, he reported significant improvement, he no longer walks with  a cane, He go to church on Sunday without his ankle bracelet on sometimes. The numbness in his neck is now receding to his plantar feet only.  UPDATE June 26th 2014: He continues to show improvement with ivig, he is no longer staggering as much, walk with out assistant, wearing bilateral ankle brace, he had infusion every 3 weeks, no significant side effect noticed.  UPDATE March 6th 2015: Overall, he does very well, work 8 hours each day, he was switched from [place] to [place] since February 2015. He completed two infusions, through home health agent [facility], last infusion was March second, and third, he complains of increased bilateral feet burning, numbness, especially at the ball of his feet, he continue to walk with a brace, which has been very helpful  He has been taking Cymbalta 60 mg every morning for a while, was not sure it is helping him, he denies significant side effect  He denies bilateral upper extremity symptoms, no incontinence  UPDATE Nov 11th 2015: He is receiving ivig 0.5 g/kg x2, every 3 weeks, He continue to show mild steady improvement over time, he is no longer wearing ankle brace, he now complains of low back pain, no bowel bladder incontinence  UPDATE Feb 14 2015: He no longer wear ankle brace, ambulate without assistance, complains of bilateral plantar surface pain, especially after prolonged walking In June 2016 he developed atrial fibrillation, went through cardiac conversion successfully, now he is taking anticoagulations Xelrato  He still receiving IVIG, 1 g/kg every 3 weeks  UPDATE Feb 14 2016: He continued to show mild improvement with continued IVIG treatment, the next 10% 1 g/kg (  he weighed 86 kg,= use 90 g, divided into 2 doses every 3 weeks), he is getting home infusion, hope to change the schedule to every month,  He now ambulate without ankle brace, denies upper extremity involvement. He does have numbness, deep achy pain of bilateral foot. Cymbalta 60  mg has been helpful,  UPDATE November 14 2016: His overall doing very well, continued IVIG treatment 10%, 1g/kg divided into 2 days.  He complains some right hip pain, ambulate without ankle brace, still has significant gait abnormality,  UPDATE Feb 24 2017: He is still getting IVIG 10%, 1 g/kg every month, doing very well, continued to work full-time, he does has mild gait abnormality, dragging his right leg more,  He denied loss of sense of smell, sleeps well,  UPDATE May 18 2017: He is accompanied by his son at visit, he is over all doing well, sinemet 25/100 tid has been helpful,  He reported 50% improvement.  UpDate November 12 2017: He is overall doing well, still get benefit from ivig.  He is also taking Sinemet 25/100 tid, no significant side effect noticed.  Continue have significant gait abnormality due to distal leg weakness, numbness, Cymbalta 60 mg daily has helped his neuropathic pain  REVIEW OF SYSTEMS: Full 14 system review of systems performed and notable only for  gait difficulty  ALLERGIES: Allergies  Allergen Reactions  . Ezetimibe     Other reaction(s): Arthralgias (intolerance)  . Fenofibrate Micronized     Other reaction(s): Arthralgias (intolerance)  . Statins     Other reaction(s): Arthralgias (intolerance)    HOME MEDICATIONS: Current Outpatient Medications  Medication Sig Dispense Refill  . amiodarone (PACERONE) 200 MG tablet Take 200 mg by mouth 2 (two) times daily.    . carbidopa-levodopa (SINEMET IR) 25-100 MG tablet Take 1 tablet by mouth 3 (three) times daily. 90 tablet 6  . DULoxetine (CYMBALTA) 60 MG capsule Take 1 capsule (60 mg total) by mouth daily. 90 capsule 4  . Immune Globulin 10% (GAMUNEX-C) 10 GM/100ML SOLN Inject 1 g/kg into the vein every 30 (thirty) days.    . metFORMIN (GLUCOPHAGE) 500 MG tablet Take by mouth daily.    . metoprolol tartrate (LOPRESSOR) 25 MG tablet Take 25 mg by mouth daily.    . rivaroxaban (XARELTO) 20 MG TABS tablet  Take 20 mg by mouth daily.     No current facility-administered medications for this visit.     PAST MEDICAL HISTORY: Past Medical History:  Diagnosis Date  . Abnormality of gait 07/07/2012  . CIDP (chronic inflammatory demyelinating polyneuropathy) ([place])   . CIDP (chronic inflammatory demyelinating polyneuropathy) ([place]) 07/07/2012  . Foot drop   . Gait abnormality   . Heart disease   . Irregular heartbeat   . Pre-diabetes   . Prostate cancer ([place])     PAST SURGICAL HISTORY: History reviewed. No pertinent surgical history.  FAMILY HISTORY: History reviewed. No pertinent family history.  SOCIAL HISTORY:  Social History   Socioeconomic History  . Marital status: Married    Spouse name: Webb Silversmith  . Number of children: 2  . Years of education: Not on file  . Highest education level: Not on file  Occupational History  . Not on file  Social Needs  . Financial resource strain: Not on file  . Food insecurity:    Worry: Not on file    Inability: Not on file  . Transportation needs:    Medical: Not on file    Non-medical: Not  on file  Tobacco Use  . Smoking status: Never Smoker  . Smokeless tobacco: Never Used  Substance and Sexual Activity  . Alcohol use: No    Alcohol/week: 0.0 oz  . Drug use: No  . Sexual activity: Not on file  Lifestyle  . Physical activity:    Days per week: Not on file    Minutes per session: Not on file  . Stress: Not on file  Relationships  . Social connections:    Talks on phone: Not on file    Gets together: Not on file    Attends religious service: Not on file    Active member of club or organization: Not on file    Attends meetings of clubs or organizations: Not on file    Relationship status: Not on file  . Intimate partner violence:    Fear of current or ex partner: Not on file    Emotionally abused: Not on file    Physically abused: Not on file    Forced sexual activity: Not on file  Other Topics Concern  . Not on file  Social  History Narrative   Patient lives at home with his wife [name]). Patient is still working. Two children.Right handed.   Caffeine- two cups.        PHYSICAL EXAM   Vitals:   11/12/17 1009  BP: (!) 144/70  Pulse: 62  Weight: 190 lb (86.2 kg)  Height: 5\' 9"  (1.753 m)    Not recorded      Body mass index is 28.06 kg/m.  PHYSICAL EXAMNIATION:  Gen: NAD, conversant, well nourised, obese, well groomed                     Cardiovascular: Regular rate rhythm, no peripheral edema, warm, nontender. Eyes: Conjunctivae clear without exudates or hemorrhage Neck: Supple, no carotid bruise. Pulmonary: Clear to auscultation bilaterally   NEUROLOGICAL EXAM:  MENTAL STATUS: Speech:    Speech is normal; fluent and spontaneous with normal comprehension.  Cognition:     Orientation to time, place and person     Normal recent and remote memory     Normal Attention span and concentration     Normal Language, naming, repeating,spontaneous speech     Fund of knowledge   CRANIAL NERVES: CN II: Visual fields are full to confrontation. Pupils are round equal and briskly reactive to light. CN III, IV, VI: extraocular movement are normal. No ptosis. CN V: Facial sensation is intact to pinprick in all 3 divisions bilaterally. Corneal responses are intact.  CN VII: Face is symmetric with normal eye closure and smile. CN VIII: Hearing is normal to rubbing fingers CN IX, X: Palate elevates symmetrically. Phonation is normal. CN XI: Head turning and shoulder shrug are intact CN XII: Tongue is midline with normal movements and no atrophy.  MOTOR: He has mild right hand resting tremor, rigidity with reinforcement maneuver, right more than left, he has mild bilateral hip flexion weakness, moderate ankle dorsiflexion plantar flexion weakness, right worse than left, right ankle dorsiflexion 3, left 3 plus, plantar flexion right side was 4, left side was 4  REFLEXES: Reflexes are 1 and symmetric at the  biceps, triceps, absent at knees, and ankles. Plantar responses are flexor.  SENSORY: Length dependent decreased to light touch, pinprick and vibratory sensation to midshin level  COORDINATION: Rapid alternating movements and fine finger movements are intact. There is no dysmetria on finger-to-nose and heel-knee-shin.    GAIT/STANCE: Need  to push up to get up from seated position, bilateral flailed gait, left worse than right  Positive Romberg signs   DIAGNOSTIC DATA (LABS, IMAGING, TESTING) - I reviewed patient records, labs, notes, testing and imaging myself where available.   ASSESSMENT AND PLAN  Lyriq Jarchow is a 82 y.o. male    Chronic inflammatory demyelinating polyneuropathy  He has significant bilateral foot drop, gait difficulty,   IVIG treatment since September 2013, he continue to feel the persistent benefit  Continue 1g/kg (0.5 g/kgx2) q 4 weeks, (was on every 3 weeks before Nov 2017)  Repeat EMG nerve conduction study on November 14 2016 showed no significant change compared to previous study in April 2015, continued evidence of chronic demyelinating poly-radicular neuropathy, with superimposed right median neuropathy.  There is no significant axonal loss based on EMG findings.  Parkinson's disease.  Tolerating Sinemet 25/100 mg 3 times a day well,  Neuropathic pain  Continue Cymbalta 60 mg daily  Atrial fibrillation  Keep xelrato  Fall precaution   New-onset parkinsonian features  Right hand resting tremor, rigidity, mild bradykinesia, mild nuchal rigidity, worsening gait abnormality  Did well with Sinemet 25/100 tid.  Marcial Pacas, M.D. Ph.D.  [facility] [address] [place], [place] [address] Ph: [phone] Fax: [phone]  CC: Referring Provider

## 2017-11-16 ENCOUNTER — Ambulatory Visit: Payer: Medicare Other | Admitting: Neurology

## 2018-05-17 ENCOUNTER — Ambulatory Visit: Payer: Medicare Other | Admitting: Adult Health

## 2018-05-17 ENCOUNTER — Other Ambulatory Visit: Payer: Self-pay

## 2018-05-17 ENCOUNTER — Encounter: Payer: Self-pay | Admitting: Adult Health

## 2018-05-17 VITALS — BP 133/65 | HR 56 | Ht 69.0 in | Wt 188.2 lb

## 2018-05-17 DIAGNOSIS — R259 Unspecified abnormal involuntary movements: Secondary | ICD-10-CM

## 2018-05-17 DIAGNOSIS — M792 Neuralgia and neuritis, unspecified: Secondary | ICD-10-CM

## 2018-05-17 DIAGNOSIS — G6181 Chronic inflammatory demyelinating polyneuritis: Secondary | ICD-10-CM | POA: Diagnosis not present

## 2018-05-17 MED ORDER — CARBIDOPA-LEVODOPA 25-100 MG PO TABS: 0.5000 | ORAL_TABLET | Freq: Three times a day (TID) | ORAL | 6 refills | Status: DC

## 2018-05-17 NOTE — Progress Notes (Signed)
I have reviewed and agreed above plan. 

## 2018-05-17 NOTE — Patient Instructions (Addendum)
Your Plan:  Continue continued use of IVIG and Cymbalta  Due to nausea with use of Sinemet, try to do a half tablet after eating in the morning and evening (after dinner). Do this for 2 weeks. If you notice improvement of your nausea, you can start to do half tablet after lunch. Please keep [place] updated on how you are doing with this.   Call office with any worsening symptoms or questions/concerns    Follow-up in 6 months with Jinny Blossom, NP or call earlier if needed        Thank you for coming to see [place] at [facility]. I hope we have been able to provide you high quality care today.  You may receive a patient satisfaction survey over the next few weeks. We would appreciate your feedback and comments so that we may continue to improve ourselves and the health of our patients.

## 2018-05-17 NOTE — Progress Notes (Signed)
PATIENT: Manuel Sampson DOB: 08-30-1935  Chief Complaint  Patient presents with  . Follow-up    New room, alone. Last seen 11/12/17 by Dr. Krista Blue. No new sx.   Marland Kitchen CIDP    IVIG infusions are beneficial per pt. Tolerating well.   . Peripheral Neuropathy    Cymbalta 60mg  qd  . Tremors    Tremors the same since last visit. He feels sinemet is causing him to be sick to his stomach. Has N/V.      HISTORICAL  Manuel Sampson, seen in refer by    HPI:  05/17/18  Manuel Sampson is a 83 years old male who is being followed in this office for CIDP, neuropathic pain and Parkinson's.  He continues to receive IVIG with benefit of CIDP.  He continues on Cymbalta 60 mg daily with benefit of neuropathic pain.  Sinemet 25/103 times daily initiated in 11/2017 and initially tolerating well but over the past 3 to 4 weeks, he endorses nausea approximately 30 minutes after taking therefore he has being only using 1x per day.  He usually takes the morning dose prior to breakfast and with take the afternoon and evening dose sometimes before and sometimes after a meal.  He does endorse great benefit with use of Sinemet and denies worsening of tremors or bradykinesia since decreasing dose.  He returns today for reevaluation.    HISTORY SUMMARY: He was previously healthy, highly functional, was diagnosed with prostate cancer based on PSA elevation, had pelvic radiation since August 2012, lasting for 2 months, with seed implantation,  Around 2012, he first noticed bilateral feet numbness tingling burning achy pain, couple weeks later, he noticed gait difficulty, stumbling, right worse than left, symptoms continued to get worse, plateued within 6-7 months, he had a slight improvement over the past couple months, but still had significant gait difficulty,  He denies incontinence, no bilateral hands paresthesia, no fingertips numbness tingling, no bad weakness,  MRI of lumbar reviewed, from [facility], L4-5 right  posterior lateral disc extrusion with downward turning, with right L5 encroachment, moderate bilateral foraminal stenosis  Electrodiagnostic study in November 18, 2011, showed prolonged F-wave latency of all the upper extremity motor nerves tested. Absence bilateral lower extremity motor and sensory responses, activity denervations involving bilateral distal leg muscles, consistent with demyelinating polyradiculoneuropathy   CSF showed elevated TP  72, glucose 79,  Labs, normal CMP, with exception of glucose 163, PEP,cbc, lyme titer, ssa,b, tsh,ace, ana, rpr, vit b12, crp,  He began to receive IVIG treatment from home health since Sep 2013,  1g/kg every 3 weeks, he tolerated the treatment very well, he reported significant improvement, he no longer walks with a cane, He go to church on Sunday without his ankle bracelet on sometimes. The numbness in his neck is now receding to his plantar feet only.      REVIEW OF SYSTEMS: Full 14 system review of systems performed and notable only for see HPI  ALLERGIES: Allergies  Allergen Reactions  . Ezetimibe     Other reaction(s): Arthralgias (intolerance)  . Fenofibrate Micronized     Other reaction(s): Arthralgias (intolerance)  . Statins     Other reaction(s): Arthralgias (intolerance)    HOME MEDICATIONS: Current Outpatient Medications  Medication Sig Dispense Refill  . amiodarone (PACERONE) 200 MG tablet Take 200 mg by mouth 2 (two) times daily.    . carbidopa-levodopa (SINEMET IR) 25-100 MG tablet Take 0.5 tablets by mouth 3 (three) times daily. 270 tablet  6  . DULoxetine (CYMBALTA) 60 MG capsule Take 1 capsule (60 mg total) by mouth daily. 90 capsule 4  . Immune Globulin 10% (GAMUNEX-C) 10 GM/100ML SOLN Inject 1 g/kg into the vein every 30 (thirty) days.    Marland Kitchen lisinopril (PRINIVIL,ZESTRIL) 10 MG tablet Take 10 mg by mouth daily.    . metFORMIN (GLUCOPHAGE) 500 MG tablet Take by mouth daily.    . metoprolol tartrate (LOPRESSOR) 25 MG  tablet Take 25 mg by mouth daily.    . rivaroxaban (XARELTO) 20 MG TABS tablet Take 20 mg by mouth daily.     No current facility-administered medications for this visit.     PAST MEDICAL HISTORY: Past Medical History:  Diagnosis Date  . Abnormality of gait 07/07/2012  . CIDP (chronic inflammatory demyelinating polyneuropathy) ([place])   . CIDP (chronic inflammatory demyelinating polyneuropathy) ([place]) 07/07/2012  . Foot drop   . Gait abnormality   . Heart disease   . Irregular heartbeat   . Pre-diabetes   . Prostate cancer ([place])     PAST SURGICAL HISTORY: History reviewed. No pertinent surgical history.  FAMILY HISTORY: History reviewed. No pertinent family history.  SOCIAL HISTORY:  Social History   Socioeconomic History  . Marital status: Married    Spouse name: Webb Silversmith  . Number of children: 2  . Years of education: Not on file  . Highest education level: Not on file  Occupational History  . Not on file  Social Needs  . Financial resource strain: Not on file  . Food insecurity:    Worry: Not on file    Inability: Not on file  . Transportation needs:    Medical: Not on file    Non-medical: Not on file  Tobacco Use  . Smoking status: Never Smoker  . Smokeless tobacco: Never Used  Substance and Sexual Activity  . Alcohol use: No    Alcohol/week: 0.0 standard drinks  . Drug use: No  . Sexual activity: Not on file  Lifestyle  . Physical activity:    Days per week: Not on file    Minutes per session: Not on file  . Stress: Not on file  Relationships  . Social connections:    Talks on phone: Not on file    Gets together: Not on file    Attends religious service: Not on file    Active member of club or organization: Not on file    Attends meetings of clubs or organizations: Not on file    Relationship status: Not on file  . Intimate partner violence:    Fear of current or ex partner: Not on file    Emotionally abused: Not on file    Physically abused: Not on  file    Forced sexual activity: Not on file  Other Topics Concern  . Not on file  Social History Narrative   Patient lives at home with his wife [name]). Patient is still working. Two children.Right handed.   Caffeine- two cups.        PHYSICAL EXAM   Vitals:   05/17/18 0746  BP: 133/65  Pulse: (!) 56  Weight: 188 lb 3.2 oz (85.4 kg)  Height: 5\' 9"  (1.753 m)    Not recorded      Body mass index is 27.79 kg/m.  PHYSICAL EXAMNIATION:  Gen: NAD, conversant, well nourised, obese, well groomed                     Cardiovascular: Regular  rate rhythm, no peripheral edema, warm, nontender. Eyes: Conjunctivae clear without exudates or hemorrhage Neck: Supple, no carotid bruise. Pulmonary: Clear to auscultation bilaterally   NEUROLOGICAL EXAM:  MENTAL STATUS: Speech:    Speech is normal; fluent and spontaneous with normal comprehension.  Cognition:     Orientation to time, place and person     Normal recent and remote memory     Normal Attention span and concentration     Normal Language, naming, repeating,spontaneous speech     Fund of knowledge   CRANIAL NERVES: CN II: Visual fields are full to confrontation. Pupils are round equal and briskly reactive to light. CN III, IV, VI: extraocular movement are normal. No ptosis. CN V: Facial sensation is intact to pinprick in all 3 divisions bilaterally. Corneal responses are intact.  CN VII: Face is symmetric with normal eye closure and smile. CN VIII: Hearing is normal to rubbing fingers CN IX, X: Palate elevates symmetrically. Phonation is normal. CN XI: Head turning and shoulder shrug are intact CN XII: Tongue is midline with normal movements and no atrophy.  MOTOR: Unable to appreciate resting or action tremor, rigidity with reinforcement maneuver, right more than left, moderate ankle dorsiflexion plantar flexion weakness, right worse than left.  Bradykinesia present  REFLEXES: Reflexes are 1 and symmetric at the  biceps, triceps, absent at knees, and ankles.   SENSORY: Length dependent decreased to light touch, pinprick and vibratory sensation to midshin level  COORDINATION: Rapid alternating movements and fine finger movements are intact. There is no dysmetria on finger-to-nose and heel-knee-shin.    GAIT/STANCE: Need to push up to get up from seated position, bilateral flailed gait, left worse than right    ASSESSMENT AND PLAN  Manuel Sampson is a 83 y.o. male    Chronic inflammatory demyelinating polyneuropathy  He has moderate bilateral foot drop with gait difficulty,   IVIG treatment since September 2013 with continued benefit  Continue 1g/kg (0.5 g/kgx2) q 4 weeks  Parkinson's disease.  Due to nausea symptoms, recommend Sinemet 25/100 half tab twice daily after meals.  If nausea  stable after 2 weeks, advised him to increase to half tab 3 times daily.  If he continues to experience  nausea with half dose, can consider increasing carbidopa dosage -discussed plan with Dr. Krista Blue  Neuropathic pain  Continue Cymbalta 60 mg daily  New-onset parkinsonian features  Improvement of parkinsonian features including tremor, rigidity, bradykinesia and gait abnormality  with use of Sinemet  Sinemet continuation as above  Follow-up in 6 months with Jinny Blossom, NP but advised to call earlier with questions, concerns or worsening of symptoms  Greater than 50% of time during this 25 minute visit was spent on counseling,explanation of diagnosis of CIDP, Parkinson's and neuropathic pain, planning of further management, discussion with patient and family and coordination of care    Venancio Poisson, AGNP-BC  [facility] [address] [place], [place] [address]  Phone [phone] Fax [phone] Note: This document was prepared with digital dictation and possible smart phrase technology. Any transcriptional errors that result from this process are unintentional.

## 2018-11-15 ENCOUNTER — Encounter: Payer: Self-pay | Admitting: Adult Health

## 2018-11-15 ENCOUNTER — Ambulatory Visit (INDEPENDENT_AMBULATORY_CARE_PROVIDER_SITE_OTHER): Payer: Medicare Other | Admitting: Adult Health

## 2018-11-15 ENCOUNTER — Other Ambulatory Visit: Payer: Self-pay

## 2018-11-15 VITALS — BP 156/74 | HR 71 | Temp 98.4°F | Ht 71.0 in | Wt 193.4 lb

## 2018-11-15 DIAGNOSIS — G6181 Chronic inflammatory demyelinating polyneuritis: Secondary | ICD-10-CM | POA: Diagnosis not present

## 2018-11-15 DIAGNOSIS — M792 Neuralgia and neuritis, unspecified: Secondary | ICD-10-CM

## 2018-11-15 DIAGNOSIS — R259 Unspecified abnormal involuntary movements: Secondary | ICD-10-CM

## 2018-11-15 MED ORDER — CARBIDOPA-LEVODOPA 25-100 MG PO TABS: 0.5000 | ORAL_TABLET | Freq: Two times a day (BID) | ORAL | 3 refills | Status: DC

## 2018-11-15 MED ORDER — DULOXETINE HCL 60 MG PO CPEP: 60.0000 mg | ORAL_CAPSULE | Freq: Every day | ORAL | 3 refills | Status: DC

## 2018-11-15 NOTE — Progress Notes (Signed)
PATIENT: Manuel Sampson DOB: [birth year]  Chief Complaint  Patient presents with  . Follow-up    6 mon f/u. Alone. Treatment room. No new concerns at this time.      HISTORICAL  HPI:  11/15/18 visit Manuel Sampson is a 83 year old male who is being seen today for follow-up regarding CIDP, neuropathic pain and Parkinson's.  All follow-up conditions have been stable with ongoing use of IVIG every 4 weeks, Cymbalta 60 mg daily and Sinemet 0.5 mg twice daily.  At prior visit, complains of nausea therefore decreased dose to half tab twice daily and increase to 3 times daily if able.  He continued on twice daily dosage and denies any worsening in any of his symptoms.  Continues to be ambulatory without use of assistive device and denies any recent falls.  No concerns at this time.   05/17/2018 visit Manuel Sampson: ManuelSampson is a 83 years old male who is being followed in this office for CIDP, neuropathic pain and Parkinson's.  He continues to receive IVIG with benefit of CIDP.  He continues on Cymbalta 60 mg daily with benefit of neuropathic pain.  Sinemet 25/103 times daily initiated in 11/2017 and initially tolerating well but over the past 3 to 4 weeks, he endorses nausea approximately 30 minutes after taking therefore he has being only using 1x per day.  He usually takes the morning dose prior to breakfast and with take the afternoon and evening dose sometimes before and sometimes after a meal.  He does endorse great benefit with use of Sinemet and denies worsening of tremors or bradykinesia since decreasing dose.  He returns today for reevaluation.   HISTORY SUMMARY: He was previously healthy, highly functional, was diagnosed with prostate cancer based on PSA elevation, had pelvic radiation since August 2012, lasting for 2 months, with seed implantation,  Around 2012, he first noticed bilateral feet numbness tingling burning achy pain, couple weeks later, he noticed gait difficulty, stumbling, right worse  than left, symptoms continued to get worse, plateued within 6-7 months, he had a slight improvement over the past couple months, but still had significant gait difficulty,  He denies incontinence, no bilateral hands paresthesia, no fingertips numbness tingling, no bad weakness,  MRI of lumbar reviewed, from [facility], L4-5 right posterior lateral disc extrusion with downward turning, with right L5 encroachment, moderate bilateral foraminal stenosis  Electrodiagnostic study in November 18, 2011, showed prolonged F-wave latency of all the upper extremity motor nerves tested. Absence bilateral lower extremity motor and sensory responses, activity denervations involving bilateral distal leg muscles, consistent with demyelinating polyradiculoneuropathy   CSF showed elevated TP  72, glucose 79,  Labs, normal CMP, with exception of glucose 163, PEP,cbc, lyme titer, ssa,b, tsh,ace, ana, rpr, vit b12, crp,  He began to receive IVIG treatment from home health since Sep 2013,  1g/kg every 3 weeks, he tolerated the treatment very well, he reported significant improvement, he no longer walks with a cane, He go to church on Sunday without his ankle bracelet on sometimes. The numbness in his neck is now receding to his plantar feet only.      REVIEW OF SYSTEMS: Full 14 system review of systems performed and notable only for see HPI  ALLERGIES: Allergies  Allergen Reactions  . Ezetimibe     Other reaction(s): Arthralgias (intolerance)  . Fenofibrate Micronized     Other reaction(s): Arthralgias (intolerance)  . Statins     Other reaction(s): Arthralgias (intolerance)  HOME MEDICATIONS: Current Outpatient Medications  Medication Sig Dispense Refill  . amiodarone (PACERONE) 200 MG tablet Take 200 mg by mouth 2 (two) times daily.    . carbidopa-levodopa (SINEMET IR) 25-100 MG tablet Take 0.5 tablets by mouth 2 (two) times a day. 90 tablet 3  . DULoxetine (CYMBALTA) 60 MG capsule Take 1  capsule (60 mg total) by mouth daily. 90 capsule 3  . Immune Globulin 10% (GAMUNEX-C) 10 GM/100ML SOLN Inject 1 g/kg into the vein every 30 (thirty) days.    Marland Kitchen lisinopril (PRINIVIL,ZESTRIL) 10 MG tablet Take 10 mg by mouth daily.    . metFORMIN (GLUCOPHAGE) 500 MG tablet Take by mouth daily.    . metoprolol tartrate (LOPRESSOR) 25 MG tablet Take 25 mg by mouth daily.    . rivaroxaban (XARELTO) 20 MG TABS tablet Take 20 mg by mouth daily.     No current facility-administered medications for this visit.     PAST MEDICAL HISTORY: Past Medical History:  Diagnosis Date  . Abnormality of gait 07/07/2012  . CIDP (chronic inflammatory demyelinating polyneuropathy) ([place])   . CIDP (chronic inflammatory demyelinating polyneuropathy) ([place]) 07/07/2012  . Foot drop   . Gait abnormality   . Heart disease   . Irregular heartbeat   . Pre-diabetes   . Prostate cancer ([place])     PAST SURGICAL HISTORY: History reviewed. No pertinent surgical history.  FAMILY HISTORY: History reviewed. No pertinent family history.  SOCIAL HISTORY:  Social History   Socioeconomic History  . Marital status: Married    Spouse name: Webb Silversmith  . Number of children: 2  . Years of education: Not on file  . Highest education level: Not on file  Occupational History  . Not on file  Social Needs  . Financial resource strain: Not on file  . Food insecurity    Worry: Not on file    Inability: Not on file  . Transportation needs    Medical: Not on file    Non-medical: Not on file  Tobacco Use  . Smoking status: Never Smoker  . Smokeless tobacco: Never Used  Substance and Sexual Activity  . Alcohol use: No    Alcohol/week: 0.0 standard drinks  . Drug use: No  . Sexual activity: Not on file  Lifestyle  . Physical activity    Days per week: Not on file    Minutes per session: Not on file  . Stress: Not on file  Relationships  . Social Herbalist on phone: Not on file    Gets together: Not on file     Attends religious service: Not on file    Active member of club or organization: Not on file    Attends meetings of clubs or organizations: Not on file    Relationship status: Not on file  . Intimate partner violence    Fear of current or ex partner: Not on file    Emotionally abused: Not on file    Physically abused: Not on file    Forced sexual activity: Not on file  Other Topics Concern  . Not on file  Social History Narrative   Patient lives at home with his wife [name]). Patient is still working. Two children.Right handed.   Caffeine- two cups.        PHYSICAL EXAM   Vitals:   11/15/18 0932  BP: (!) 156/74  Pulse: 71  Temp: 98.4 F (36.9 C)  TempSrc: Oral  Weight: 193 lb 6.4 oz (87.7  kg)  Height: 5\' 11"  (1.803 m)    Not recorded      Body mass index is 26.97 kg/m.  PHYSICAL EXAMNIATION:  Gen: NAD, conversant, well nourised, obese, well groomed                     Cardiovascular: Regular rate rhythm, no peripheral edema, warm, nontender. Eyes: Conjunctivae clear without exudates or hemorrhage Neck: Supple, no carotid bruise. Pulmonary: Clear to auscultation bilaterally   NEUROLOGICAL EXAM:  MENTAL STATUS: Speech:    Speech is normal; fluent and spontaneous with normal comprehension.  Cognition:     Orientation to time, place and person     Normal recent and remote memory     Normal Attention span and concentration     Normal Language, naming, repeating,spontaneous speech     Fund of knowledge intact   CRANIAL NERVES: CN II: Visual fields are full to confrontation. Pupils are round equal and briskly reactive to light. CN III, IV, VI: extraocular movement are normal. No ptosis. CN V: Facial sensation is intact to pinprick in all 3 divisions bilaterally.  CN VII: Face is symmetric with normal eye closure and smile. CN VIII: Hearing is normal bilaterally to rubbing fingers CN IX, X: Palate elevates symmetrically. Phonation is normal. CN XI: Head turning  and shoulder shrug are intact CN XII: Tongue is midline with normal movements and no atrophy.  MOTOR: Unable to appreciate resting or action tremor, rigidity with reinforcement maneuver, right more than left, cogwheel rigidity left greater than right, chronic moderate ankle dorsiflexion and plantar flexion weakness, right worse than left.  Mild bradykinesia present bilaterally  REFLEXES: Reflexes are 1 and symmetric at the biceps, triceps, absent at knees, and ankles.   SENSORY: Length dependent decreased to light touch, pinprick and vibratory sensation to midshin level  COORDINATION: Rapid alternating movements and fine finger movements are intact. There is no dysmetria on finger-to-nose and heel-knee-shin.    GAIT/STANCE: Need to push up to get up from seated position, shuffling gait without assistive device.  Pill-rolling tremor noted with ambulation left greater than right    ASSESSMENT AND PLAN  Ruven Corradi is a 83 y.o. male who continues to be followed for the following conditions:  Chronic inflammatory demyelinating polyneuropathy  He has moderate bilateral foot drop -stable without worsening  IVIG treatment since September 2013 with continued benefit  Continue 1g/kg (0.5 g/kgx2) q 4 weeks  Parkinson's disease.  Continue Sinemet 0.5 tab twice daily as symptoms have been stable and was previously experiencing nausea with increased dose  Neuropathic pain  Continue Cymbalta 60 mg daily    Follow-up in 6 months with Judson Roch, NP but advised to call earlier with questions, concerns or worsening of symptoms  Greater than 50% of time during this 15 minute visit was spent on counseling,explanation of diagnosis of CIDP, Parkinson's and neuropathic pain, planning of further management, discussion with patient and family and coordination of care    Venancio Poisson, AGNP-BC  [facility] [address] [place], [place] [address]  Phone  [phone] Fax [phone] Note: This document was prepared with digital dictation and possible smart phrase technology. Any transcriptional errors that result from this process are unintentional.

## 2018-11-15 NOTE — Patient Instructions (Addendum)
Your Plan:  Continue current treatment plan with ongoing use of IVIG, Cymbalta 60 mg daily and Sinemet 0.5 tab 2 times daily   Follow-up in 6 months with Judson Roch, NP or call earlier if needed     Thank you for coming to see [place] at [facility]. I hope we have been able to provide you high quality care today.  You may receive a patient satisfaction survey over the next few weeks. We would appreciate your feedback and comments so that we may continue to improve ourselves and the health of our patients.

## 2018-11-17 NOTE — Progress Notes (Signed)
I have reviewed and agreed above plan. 

## 2019-05-18 ENCOUNTER — Ambulatory Visit: Payer: Medicare Other | Admitting: Neurology

## 2019-05-18 ENCOUNTER — Other Ambulatory Visit: Payer: Self-pay

## 2019-05-18 ENCOUNTER — Encounter: Payer: Self-pay | Admitting: Neurology

## 2019-05-18 VITALS — BP 130/84 | HR 77 | Temp 96.9°F | Ht 71.0 in | Wt 180.0 lb

## 2019-05-18 DIAGNOSIS — R259 Unspecified abnormal involuntary movements: Secondary | ICD-10-CM

## 2019-05-18 DIAGNOSIS — G6181 Chronic inflammatory demyelinating polyneuritis: Secondary | ICD-10-CM

## 2019-05-18 MED ORDER — CARBIDOPA-LEVODOPA 25-100 MG PO TABS: 1.0000 | ORAL_TABLET | Freq: Three times a day (TID) | ORAL | 5 refills | Status: DC

## 2019-05-18 NOTE — Patient Instructions (Addendum)
Try to increase the Sinemet to 0.5 tablet 3 times a day, if you do well with that after 1-2 weeks, increase to 1 mg tablet 3 times a day (you may try starting with increase the morning dose first, keep the 1/2 tablet for lunch, dinner, and increase overtime)

## 2019-05-18 NOTE — Progress Notes (Signed)
PATIENT: Manuel Sampson DOB: 02/09/1936  REASON FOR VISIT: follow up HISTORY FROM: patient  HISTORY OF PRESENT ILLNESS: Today 05/18/19  HISTORY  History of present illness: Manuel Sampson is a 84 years old right-handed Caucasian male   He was previously healthy, highly functional, was diagnosed with prostate cancer based on PSA elevation, had pelvic radiation since August 2012, lasting for 2 months, with seed implantation,  Around 2012, he first noticed bilateral feet numbness tingling burning achy pain, couple weeks later, he noticed gait difficulty, stumbling, right worse than left, symptoms continued to get worse, plateued within 6-7 months, he had a slight improvement over the past couple months, but still had significant gait difficulty,  He denies incontinence, no bilateral hands paresthesia, no fingertips numbness tingling, no bad weakness,  MRI of lumbar reviewed, from [facility], L4-5 right posterior lateral disc extrusion with downward turning, with right L5 encroachment, moderate bilateral foraminal stenosis  Electrodiagnostic study in November 18, 2011, showed prolonged F-wave latency of all the upper extremity motor nerves tested. Absence bilateral lower extremity motor and sensory responses, activity denervations involving bilateral distal leg muscles, consistent with demyelinating polyradiculoneuropathy   CSF showed elevated TP  72, glucose 79,  Labs, normal CMP, with exception of glucose 163, PEP,cbc, lyme titer, ssa,b, tsh,ace, ana, rpr, vit b12, crp,  He began to receive IVIG treatment from home health since Sep 2013,  1g/kg every 3 weeks, he tolerated the treatment very well, he reported significant improvement, he no longer walks with a cane, He go to church on Sunday without his ankle bracelet on sometimes. The numbness in his neck is now receding to his plantar feet only.  UPDATE June 26th 2014: He continues to show improvement with ivig, he is no longer  staggering as much, walk with out assistant, wearing bilateral ankle brace, he had infusion every 3 weeks, no significant side effect noticed.  UPDATE March 6th 2015: Overall, he does very well, work 8 hours each day, he was switched from [place] to [place] since February 2015. He completed two infusions, through home health agent [facility], last infusion was March second, and third, he complains of increased bilateral feet burning, numbness, especially at the ball of his feet, he continue to walk with a brace, which has been very helpful  He has been taking Cymbalta 60 mg every morning for a while, was not sure it is helping him, he denies significant side effect  He denies bilateral upper extremity symptoms, no incontinence  UPDATE Nov 11th 2015: He is receiving ivig 0.5 g/kg x2, every 3 weeks, He continue to show mild steady improvement over time, he is no longer wearing ankle brace, he now complains of low back pain, no bowel bladder incontinence  UPDATE Feb 14 2015: He no longer wear ankle brace, ambulate without assistance, complains of bilateral plantar surface pain, especially after prolonged walking In June 2016 he developed atrial fibrillation, went through cardiac conversion successfully, now he is taking anticoagulations Xelrato  He still receiving IVIG, 1 g/kg every 3 weeks  UPDATE Feb 14 2016: He continued to show mild improvement with continued IVIG treatment, the next 10% 1 g/kg (he weighed 86 kg,= use 90 g, divided into 2 doses every 3 weeks), he is getting home infusion, hope to change the schedule to every month,  He now ambulate without ankle brace, denies upper extremity involvement. He does have numbness, deep achy pain of bilateral foot. Cymbalta 60 mg has been helpful,  UPDATE November 14 2016: His overall doing very well, continued IVIG treatment 10%, 1g/kg divided into 2 days.  He complains some right hip pain, ambulate without ankle brace, still has  significant gait abnormality,  UPDATE Feb 24 2017: He is still getting IVIG 10%, 1 g/kg every month, doing very well, continued to work full-time, he does has mild gait abnormality, dragging his right leg more,  He denied loss of sense of smell, sleeps well,  UPDATE May 18 2017: He is accompanied by his son at visit, he is over all doing well, sinemet 25/100 tid has been helpful,  He reported 50% improvement.  UpDate November 12 2017: He is overall doing well, still get benefit from ivig.  He is also taking Sinemet 25/100 tid, no significant side effect noticed.  Continue have significant gait abnormality due to distal leg weakness, numbness, Cymbalta 60 mg daily has helped his neuropathic pain  11/15/2018 [name] is a 84 year old male who is being seen today for follow-up regarding CIDP, neuropathic pain and Parkinson's.  All follow-up conditions have been stable with ongoing use of IVIG every 4 weeks, Cymbalta 60 mg daily and Sinemet 0.5 mg twice daily.  At prior visit, complains of nausea therefore decreased dose to half tab twice daily and increase to 3 times daily if able.  He continued on twice daily dosage and denies any worsening in any of his symptoms.  Continues to be ambulatory without use of assistive device and denies any recent falls.  No concerns at this time.  Update May 18, 2019 [name] is here today alone, reports has been doing well, over time continue difficulty with gait.  He did have a fall on Monday, he was carrying a piece of cake going up a hill, lost his balance, says he was not injured.  He rarely falls.  He continues to get IVIG every 4 weeks, still has benefit, says it helps him to walk.  He has in both hands when eating, and at rest.  He denies any freezing episodes.  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  He is taking Sinemet 25-100, 1/2 tablet twice daily.  He takes Cymbalta for his neuropathy.  He has bilateral foot drops, does not use assistive devices  or braces.  REVIEW OF SYSTEMS: Out of a complete 14 system review of symptoms, the patient complains only of the following symptoms, and all other reviewed systems are negative.  Before  Gait abnormality  ALLERGIES: Allergies  Allergen Reactions  . Ezetimibe     Other reaction(s): Arthralgias (intolerance)  . Fenofibrate Micronized     Other reaction(s): Arthralgias (intolerance)  . Statins     Other reaction(s): Arthralgias (intolerance)    HOME MEDICATIONS: Outpatient Medications Prior to Visit  Medication Sig Dispense Refill  . amiodarone (PACERONE) 200 MG tablet Take 200 mg by mouth 2 (two) times daily.    . carbidopa-levodopa (SINEMET IR) 25-100 MG tablet Take 0.5 tablets by mouth 2 (two) times a day. 90 tablet 3  . DULoxetine (CYMBALTA) 60 MG capsule Take 1 capsule (60 mg total) by mouth daily. 90 capsule 3  . Immune Globulin 10% (GAMUNEX-C) 10 GM/100ML SOLN Inject 1 g/kg into the vein every 30 (thirty) days.    Marland Kitchen lisinopril (PRINIVIL,ZESTRIL) 10 MG tablet Take 10 mg by mouth daily.    . metFORMIN (GLUCOPHAGE) 500 MG tablet Take by mouth daily.    . metoprolol tartrate (LOPRESSOR) 25 MG tablet Take 25 mg by mouth daily.    Marland Kitchen  rivaroxaban (XARELTO) 20 MG TABS tablet Take 20 mg by mouth daily.     No facility-administered medications prior to visit.    PAST MEDICAL HISTORY: Past Medical History:  Diagnosis Date  . Abnormality of gait 07/07/2012  . CIDP (chronic inflammatory demyelinating polyneuropathy) ([place])   . CIDP (chronic inflammatory demyelinating polyneuropathy) ([place]) 07/07/2012  . Foot drop   . Gait abnormality   . Heart disease   . Irregular heartbeat   . Pre-diabetes   . Prostate cancer ([place])     PAST SURGICAL HISTORY: No past surgical history on file.  FAMILY HISTORY: No family history on file.  SOCIAL HISTORY: Social History   Socioeconomic History  . Marital status: Married    Spouse name: Webb Silversmith  . Number of children: 2  . Years of education:  Not on file  . Highest education level: Not on file  Occupational History  . Not on file  Tobacco Use  . Smoking status: Never Smoker  . Smokeless tobacco: Never Used  Substance and Sexual Activity  . Alcohol use: No    Alcohol/week: 0.0 standard drinks  . Drug use: No  . Sexual activity: Not on file  Other Topics Concern  . Not on file  Social History Narrative   Patient lives at home with his wife [name]). Patient is still working. Two children.Right handed.   Caffeine- two cups.      Social Determinants of Health   Financial Resource Strain:   . Difficulty of Paying Living Expenses: Not on file  Food Insecurity:   . Worried About Charity fundraiser in the Last Year: Not on file  . Ran Out of Food in the Last Year: Not on file  Transportation Needs:   . Lack of Transportation (Medical): Not on file  . Lack of Transportation (Non-Medical): Not on file  Physical Activity:   . Days of Exercise per Week: Not on file  . Minutes of Exercise per Session: Not on file  Stress:   . Feeling of Stress : Not on file  Social Connections:   . Frequency of Communication with Friends and Family: Not on file  . Frequency of Social Gatherings with Friends and Family: Not on file  . Attends Religious Services: Not on file  . Active Member of Clubs or Organizations: Not on file  . Attends Archivist Meetings: Not on file  . Marital Status: Not on file  Intimate Partner Violence:   . Fear of Current or Ex-Partner: Not on file  . Emotionally Abused: Not on file  . Physically Abused: Not on file  . Sexually Abused: Not on file   PHYSICAL EXAM  Vitals:   05/18/19 1225  BP: 130/84  Pulse: 77  Temp: (!) 96.9 F (36.1 C)  Weight: 180 lb (81.6 kg)  Height: 5\' 11"  (1.803 m)   Body mass index is 25.1 kg/m.  Generalized: Well developed, in no acute distress   Neurological examination  Mentation: Alert oriented to time, place, history taking. Follows all commands speech and  language fluent Cranial nerve II-XII: Pupils were equal round reactive to light. Extraocular movements were full, visual field were full on confrontational test. Facial sensation and strength were normal. Head turning and shoulder shrug  were normal and symmetric. Motor: Good strength of upper extremities, right hand resting tremor, pill rolling left hand, mild bilateral hip flexion weakness, moderate bilateral dorsiflexion weakness Sensory: Sensory testing is intact to soft touch on all 4 extremities. No evidence  of extinction is noted.  Coordination: Cerebellar testing reveals good finger-nose-finger and heel-to-shin bilaterally.  Gait and station: Slow to rise from seated position, has to push off, stepping, shuffling gait, bilateral foot drops, right hand resting tremor noted, difficulty with turns are slow Reflexes: Patellar reflexes are absent  DIAGNOSTIC DATA (LABS, IMAGING, TESTING) - I reviewed patient records, labs, notes, testing and imaging myself where available.  No results found for: WBC, HGB, HCT, MCV, PLT No results found for: NA, K, CL, CO2, GLUCOSE, BUN, CREATININE, CALCIUM, PROT, ALBUMIN, AST, ALT, ALKPHOS, BILITOT, GFRNONAA, GFRAA No results found for: CHOL, HDL, LDLCALC, LDLDIRECT, TRIG, CHOLHDL No results found for: HGBA1C No results found for: VITAMINB12 No results found for: TSH  ASSESSMENT AND PLAN 84 y.o. year old male  has a past medical history of Abnormality of gait (07/07/2012), CIDP (chronic inflammatory demyelinating polyneuropathy) ([place]), CIDP (chronic inflammatory demyelinating polyneuropathy) ([place]) (07/07/2012), Foot drop, Gait abnormality, Heart disease, Irregular heartbeat, Pre-diabetes, and Prostate cancer ([place]). here with:  1.  Chronic inflammatory demyelinating polyneuropathy -Has moderate bilateral foot drop, gait abnormality -IVIG treatments in September 2013 with continued benefit -Continue 1g/kg (0.5h/kgx2) q 4 weeks -Repeat EMG nerve conduction  study in August 2018 showed no significant change compared to previous study in April 2015, continued evidence of chronic demyelinating polyradicular neuropathy, with superimposed right median neuropathy, no significant axonal loss based on EMG findings -Be very careful not to fall, is taking Xarelto  2.  Parkinson's disease -Notable resting tremor to right hand, shuffling gait, trouble initiating gait, difficulty with turns, gait is slow, complicated by CIDP condition, taking low-dose Sinemet 25-100 0.5 tablet twice daily -Increase Sinemet 25-100 mg 0.5 tablet 3 times daily for 1-2 weeks, if continued to do well, increase to 1 tablet 3 times a day (increase slowly, has had nausea previously) -He continues to be very active, works full-time is his 2 factories  3.  Neuropathic pain -Continue Cymbalta 60 mg daily -He will follow-up in 6 months or sooner if needed   I spent 25 minutes with the patient. 50% of this time was spent discussing his plan of care.   Butler Denmark, AGNP-C, DNP 05/18/2019, 12:43 PM [facility] [address] [place], [place] [address] [phone]

## 2019-06-15 NOTE — Progress Notes (Signed)
I have reviewed and agreed above plan. 

## 2019-11-17 ENCOUNTER — Ambulatory Visit: Payer: Medicare Other | Admitting: Neurology

## 2019-11-17 ENCOUNTER — Encounter: Payer: Self-pay | Admitting: Neurology

## 2019-11-17 ENCOUNTER — Other Ambulatory Visit: Payer: Self-pay

## 2019-11-17 VITALS — BP 150/69 | HR 62 | Ht 71.0 in | Wt 183.5 lb

## 2019-11-17 DIAGNOSIS — M792 Neuralgia and neuritis, unspecified: Secondary | ICD-10-CM

## 2019-11-17 DIAGNOSIS — R259 Unspecified abnormal involuntary movements: Secondary | ICD-10-CM

## 2019-11-17 DIAGNOSIS — G6181 Chronic inflammatory demyelinating polyneuritis: Secondary | ICD-10-CM

## 2019-11-17 MED ORDER — DULOXETINE HCL 60 MG PO CPEP: 60.0000 mg | ORAL_CAPSULE | Freq: Every day | ORAL | 4 refills | Status: DC

## 2019-11-17 MED ORDER — CARBIDOPA-LEVODOPA 25-100 MG PO TABS: 1.0000 | ORAL_TABLET | Freq: Three times a day (TID) | ORAL | 4 refills | Status: DC

## 2019-11-17 NOTE — Progress Notes (Signed)
PATIENT: Manuel Sampson DOB: 12-22-1935  REASON FOR VISIT: follow up HISTORY FROM: patient  HISTORY OF PRESENT ILLNESS: Today 11/17/19  HISTORY  History of present illness: Manuel Sampson is a 84 years old right-handed Caucasian male   He was previously healthy, highly functional, was diagnosed with prostate cancer based on PSA elevation, had pelvic radiation since August 2012, lasting for 2 months, with seed implantation,  Around 2012, he first noticed bilateral feet numbness tingling burning achy pain, couple weeks later, he noticed gait difficulty, stumbling, right worse than left, symptoms continued to get worse, plateued within 6-7 months, he had a slight improvement over the past couple months, but still had significant gait difficulty,  He denies incontinence, no bilateral hands paresthesia, no fingertips numbness tingling, no bad weakness,  MRI of lumbar reviewed, from [facility], L4-5 right posterior lateral disc extrusion with downward turning, with right L5 encroachment, moderate bilateral foraminal stenosis  Electrodiagnostic study in November 18, 2011, showed prolonged F-wave latency of all the upper extremity motor nerves tested. Absence bilateral lower extremity motor and sensory responses, activity denervations involving bilateral distal leg muscles, consistent with demyelinating polyradiculoneuropathy   CSF showed elevated TP  72, glucose 79,  Labs, normal CMP, with exception of glucose 163, PEP,cbc, lyme titer, ssa,b, tsh,ace, ana, rpr, vit b12, crp,  He began to receive IVIG treatment from home health since Sep 2013,  1g/kg every 3 weeks, he tolerated the treatment very well, he reported significant improvement, he no longer walks with a cane, He go to church on Sunday without his ankle bracelet on sometimes. The numbness in his neck is now receding to his plantar feet only.  UPDATE June 26th 2014: He continues to show improvement with ivig, he is no longer  staggering as much, walk with out assistant, wearing bilateral ankle brace, he had infusion every 3 weeks, no significant side effect noticed.  UPDATE March 6th 2015: Overall, he does very well, work 8 hours each day, he was switched from [place] to [place] since February 2015. He completed two infusions, through home health agent [facility], last infusion was March second, and third, he complains of increased bilateral feet burning, numbness, especially at the ball of his feet, he continue to walk with a brace, which has been very helpful  He has been taking Cymbalta 60 mg every morning for a while, was not sure it is helping him, he denies significant side effect  He denies bilateral upper extremity symptoms, no incontinence  UPDATE Nov 11th 2015: He is receiving ivig 0.5 g/kg x2, every 3 weeks, He continue to show mild steady improvement over time, he is no longer wearing ankle brace, he now complains of low back pain, no bowel bladder incontinence  UPDATE Feb 14 2015: He no longer wear ankle brace, ambulate without assistance, complains of bilateral plantar surface pain, especially after prolonged walking In June 2016 he developed atrial fibrillation, went through cardiac conversion successfully, now he is taking anticoagulations Xelrato  He still receiving IVIG, 1 g/kg every 3 weeks  UPDATE Feb 14 2016: He continued to show mild improvement with continued IVIG treatment, the next 10% 1 g/kg (he weighed 86 kg,= use 90 g, divided into 2 doses every 3 weeks), he is getting home infusion, hope to change the schedule to every month,  He now ambulate without ankle brace, denies upper extremity involvement. He does have numbness, deep achy pain of bilateral foot. Cymbalta 60 mg has been helpful,  UPDATE August 3  07-22-2016: His overall doing very well, continued IVIG treatment 10%, 1g/kg divided into 2 days.  He complains some right hip pain, ambulate without ankle brace, still has  significant gait abnormality,  UPDATE Feb 24 2017: He is still getting IVIG 10%, 1 g/kg every month, doing very well, continued to work full-time, he does has mild gait abnormality, dragging his right leg more,  He denied loss of sense of smell, sleeps well,  UPDATE May 18 2017: He is accompanied by his son at visit, he is over all doing well, sinemet 25/100 tid has been helpful,  He reported 50% improvement.  UpDate November 12 2017: He is overall doing well, still get benefit from ivig.  He is also taking Sinemet 25/100 tid, no significant side effect noticed.  Continue have significant gait abnormality due to distal leg weakness, numbness, Cymbalta 60 mg daily has helped his neuropathic pain  11/15/2018 Manuel Sampson:Manuel Sampson is a 84 year old male who is being seen today for follow-up regarding CIDP, neuropathic pain and Parkinson's.  All follow-up conditions have been stable with ongoing use of IVIG every 4 weeks, Cymbalta 60 mg daily and Sinemet 0.5 mg twice daily.  At prior visit, complains of nausea therefore decreased dose to half tab twice daily and increase to 3 times daily if able.  He continued on twice daily dosage and denies any worsening in any of his symptoms.  Continues to be ambulatory without use of assistive device and denies any recent falls.  No concerns at this time.  UPDATE November 17 2019: He continues to have significant gait abnormality, but is no longer using AFO, today he pulled out of 500 pounds drum, injured his right shoulder, he denies significant low back pain,  Reviewed [facility] visit in May 2021 following a fall, MRI of the brain reported right frontal scalp hematoma, no acute abnormality, cerebral atrophy, ventriculomegaly, slight out of proportion of atrophy,  Laboratory evaluation, hemoglobin 12.8, CMP showed no significant abnormality  He continues work full-time at his [employer], his wife passed away in 07-23-19, he also called Covid in December 2020,  recovered well  He take his Sinemet 25/100 mg 3 times a day, seems to help his walking some REVIEW OF SYSTEMS: Out of a complete 14 system review of symptoms, the patient complains only of the following symptoms, and all other reviewed systems are negative.  As above    ALLERGIES: Allergies  Allergen Reactions  . Ezetimibe     Other reaction(s): Arthralgias (intolerance)  . Fenofibrate Micronized     Other reaction(s): Arthralgias (intolerance)  . Statins     Other reaction(s): Arthralgias (intolerance)    HOME MEDICATIONS: Outpatient Medications Prior to Visit  Medication Sig Dispense Refill  . amiodarone (PACERONE) 200 MG tablet Take 200 mg by mouth 2 (two) times daily.    . carbidopa-levodopa (SINEMET IR) 25-100 MG tablet Take 1 tablet by mouth 3 (three) times daily. 90 tablet 5  . DULoxetine (CYMBALTA) 60 MG capsule Take 1 capsule (60 mg total) by mouth daily. 90 capsule 3  . Immune Globulin 10% (GAMUNEX-C) 10 GM/100ML SOLN Inject 1 g/kg into the vein every 30 (thirty) days.    Marland Kitchen lisinopril (PRINIVIL,ZESTRIL) 10 MG tablet Take 10 mg by mouth daily.    . metFORMIN (GLUCOPHAGE) 500 MG tablet Take by mouth daily.    . metoprolol tartrate (LOPRESSOR) 25 MG tablet Take 25 mg by mouth daily.    . rivaroxaban (XARELTO) 20 MG TABS tablet Take 20  mg by mouth daily.     No facility-administered medications prior to visit.    PAST MEDICAL HISTORY: Past Medical History:  Diagnosis Date  . Abnormality of gait 07/07/2012  . CIDP (chronic inflammatory demyelinating polyneuropathy) ([place])   . CIDP (chronic inflammatory demyelinating polyneuropathy) ([place]) 07/07/2012  . Foot drop   . Gait abnormality   . Heart disease   . Irregular heartbeat   . Pre-diabetes   . Prostate cancer ([place])     PAST SURGICAL HISTORY: History reviewed. No pertinent surgical history.  FAMILY HISTORY: History reviewed. No pertinent family history.  SOCIAL HISTORY: Social History   Socioeconomic History   . Marital status: Widowed    Spouse name: Webb Silversmith  . Number of children: 2  . Years of education: Not on file  . Highest education level: Not on file  Occupational History  . Not on file  Tobacco Use  . Smoking status: Never Smoker  . Smokeless tobacco: Never Used  Substance and Sexual Activity  . Alcohol use: No    Alcohol/week: 0.0 standard drinks  . Drug use: No  . Sexual activity: Not on file  Other Topics Concern  . Not on file  Social History Narrative   Patient lives at home with his wife [name]). Patient is still working. Two children.Right handed.   Caffeine- two cups.      Social Determinants of Health   Financial Resource Strain:   . Difficulty of Paying Living Expenses:   Food Insecurity:   . Worried About Charity fundraiser in the Last Year:   . Arboriculturist in the Last Year:   Transportation Needs:   . Film/video editor (Medical):   Marland Kitchen Lack of Transportation (Non-Medical):   Physical Activity:   . Days of Exercise per Week:   . Minutes of Exercise per Session:   Stress:   . Feeling of Stress :   Social Connections:   . Frequency of Communication with Friends and Family:   . Frequency of Social Gatherings with Friends and Family:   . Attends Religious Services:   . Active Member of Clubs or Organizations:   . Attends Archivist Meetings:   Marland Kitchen Marital Status:   Intimate Partner Violence:   . Fear of Current or Ex-Partner:   . Emotionally Abused:   Marland Kitchen Physically Abused:   . Sexually Abused:    PHYSICAL EXAM  Vitals:   11/17/19 1312  BP: (!) 150/69  Pulse: 62  Weight: 183 lb 8 oz (83.2 kg)  Height: 5\' 11"  (1.803 m)   Body mass index is 25.59 kg/m.   PHYSICAL EXAMNIATION:  Gen: NAD, conversant, well nourised, well groomed                     Cardiovascular: Regular rate rhythm, no peripheral edema, warm, nontender. Eyes: Conjunctivae clear without exudates or hemorrhage Neck: Supple, no carotid bruits. Pulmonary: Clear to  auscultation bilaterally   NEUROLOGICAL EXAM:  MENTAL STATUS: Speech:    Speech is normal; fluent and spontaneous with normal comprehension.  Cognition:     Orientation to time, place and person     Normal recent and remote memory     Normal Attention span and concentration     Normal Language, naming, repeating,spontaneous speech     Fund of knowledge   CRANIAL NERVES: CN II: Visual fields are full to confrontation.  Pupils are round equal and briskly reactive to light. CN III, IV,  VI: extraocular movement are normal. No ptosis. CN V: Facial sensation is intact to pinprick in all 3 divisions bilaterally. Corneal responses are intact.  CN VII: Face is symmetric with normal eye closure and smile. CN VIII: Hearing is normal to casual conversation CN IX, X: Phonation is normal. CN XI: Head turning and shoulder shrug are intact   MOTOR: Limited range of motion of right shoulder, no significant upper extremity proximal muscle weakness, significant bilateral ankle dorsiflexion weakness, right 3, left 4, plantarflexion 4/4+  REFLEXES: Areflexia  SENSORY: Length dependent decreased vibratory sensation, pinprick to ankle level, loss of toe proprioception  COORDINATION: Rapid alternating movements and fine finger movements are intact. There is no dysmetria on finger-to-nose and heel-knee-shin.    GAIT/STANCE: He needs push-up to get up from seated position, very unsteady, bilateral flare gait, worse on the right side, Romberg is positive    DIAGNOSTIC DATA (LABS, IMAGING, TESTING) - I reviewed patient records, labs, notes, testing and imaging myself where available.  No results found for: WBC, HGB, HCT, MCV, PLT No results found for: NA, K, CL, CO2, GLUCOSE, BUN, CREATININE, CALCIUM, PROT, ALBUMIN, AST, ALT, ALKPHOS, BILITOT, GFRNONAA, GFRAA No results found for: CHOL, HDL, LDLCALC, LDLDIRECT, TRIG, CHOLHDL No results found for: HGBA1C No results found for: VITAMINB12 No  results found for: TSH  ASSESSMENT AND PLAN 84 y.o. year old male  Chronic inflammatory demyelinating polyneuropathy  Has been on every months IVIG 1 g/kg since 2013, through home agent, tolerating it well, continue to report improvement,  EMG nerve conduction study in August 2018 showed no significant change compared to previous study in April 2015, continued evidence of chronic demyelinating polyradicular neuropathy, with superimposed right median neuropathy, no significant axonal loss based on EMG findings  Cymbalta 60 mg for neuropathic pain  Parkinsonism  Responding to Sinemet 25/100 mg 3 times daily  Gait abnormality  Is taking Xarelto, no longer wearing AFO, significant distal weakness,  Have emphasized importance of avoiding fall, ask his son to be with him at next visit,  Refused physical therapy  Marcial Pacas, M.D. Ph.D.  [facility] [place], [place] [address] Phone: [phone] Fax:      [phone]

## 2019-11-23 ENCOUNTER — Telehealth: Payer: Self-pay | Admitting: *Deleted

## 2019-11-23 NOTE — Telephone Encounter (Signed)
Faxed signed IVIG orders back to [facility] at [phone]. Received fax confirmation.

## 2020-03-13 ENCOUNTER — Telehealth: Payer: Self-pay | Admitting: *Deleted

## 2020-03-13 NOTE — Telephone Encounter (Signed)
We received an order request from [facility] to continue this patient's IVIG therapy. He had a recent hospitalization for acute respiratory failure and congestive heart failure. He was last seen in our office on 11/17/19. He has a pending appt on 05/21/20.  Per vo by Dr. Krista Blue, she would like to hold his IVIG treatment for now and see him earlier for an evaluation.  We communicated this information to [facility] [phone]). I spoke to pharmacist, Santiago Glad, who placed this medication on hold until further direction from Dr. Krista Blue.   Also, the patient was called and verbalized understanding of the plan. He has been rescheduled to see Dr. Krista Blue on 04/05/20. He was encouraged to bring his son with him to this appointment.

## 2020-04-05 ENCOUNTER — Telehealth: Payer: Self-pay | Admitting: Neurology

## 2020-04-05 ENCOUNTER — Encounter: Payer: Self-pay | Admitting: Neurology

## 2020-04-05 ENCOUNTER — Other Ambulatory Visit: Payer: Self-pay

## 2020-04-05 ENCOUNTER — Ambulatory Visit: Payer: Medicare Other | Admitting: Neurology

## 2020-04-05 VITALS — BP 160/68 | HR 60 | Ht 71.0 in | Wt 178.0 lb

## 2020-04-05 DIAGNOSIS — G20 Parkinson's disease: Secondary | ICD-10-CM | POA: Diagnosis not present

## 2020-04-05 DIAGNOSIS — G6181 Chronic inflammatory demyelinating polyneuritis: Secondary | ICD-10-CM

## 2020-04-05 DIAGNOSIS — R269 Unspecified abnormalities of gait and mobility: Secondary | ICD-10-CM | POA: Diagnosis not present

## 2020-04-05 MED ORDER — CARBIDOPA-LEVODOPA 25-100 MG PO TABS: 2.0000 | ORAL_TABLET | Freq: Three times a day (TID) | ORAL | 11 refills | Status: DC

## 2020-04-05 NOTE — Telephone Encounter (Signed)
[facility] ([place], contact [place], [phone])  I have called, left message for him to call back, want to restart IVIG through [facility] 1g/kg divided into two days.  Last infusion was in Oct 2021

## 2020-04-05 NOTE — Progress Notes (Signed)
PATIENT: Manuel Sampson DOB: March 17, 1936  REASON FOR VISIT: follow up HISTORY FROM: patient  HISTORY OF PRESENT ILLNESS: Today 04/05/20  HISTORY  History of present illness: Manuel Sampson is a 84 years old right-handed Caucasian male   He was previously healthy, highly functional, was diagnosed with prostate cancer based on PSA elevation, had pelvic radiation since August 2012, lasting for 2 months, with seed implantation,  Around 2012, he first noticed bilateral feet numbness tingling burning achy pain, couple weeks later, he noticed gait difficulty, stumbling, right worse than left, symptoms continued to get worse, plateued within 6-7 months, he had a slight improvement over the past couple months, but still had significant gait difficulty,  He denies incontinence, no bilateral hands paresthesia, no fingertips numbness tingling, no bad weakness,  MRI of lumbar reviewed, from [facility], L4-5 right posterior lateral disc extrusion with downward turning, with right L5 encroachment, moderate bilateral foraminal stenosis  Electrodiagnostic study in November 18, 2011, showed prolonged F-wave latency of all the upper extremity motor nerves tested. Absence bilateral lower extremity motor and sensory responses, activity denervations involving bilateral distal leg muscles, consistent with demyelinating polyradiculoneuropathy   CSF showed elevated TP  72, glucose 79,  Labs, normal CMP, with exception of glucose 163, PEP,cbc, lyme titer, ssa,b, tsh,ace, ana, rpr, vit b12, crp,  He began to receive IVIG treatment from home health since Sep 2013,  1g/kg every 3 weeks, he tolerated the treatment very well, he reported significant improvement, he no longer walks with a cane, He go to church on Sunday without his ankle bracelet on sometimes. The numbness in his neck is now receding to his plantar feet only.  UPDATE June 26th 2014: He continues to show improvement with ivig, he is no longer  staggering as much, walk with out assistant, wearing bilateral ankle brace, he had infusion every 3 weeks, no significant side effect noticed.  UPDATE March 6th 2015: Overall, he does very well, work 8 hours each day, he was switched from [employer] to [employer] since February 2015. He completed two infusions, through home health agent [facility], last infusion was March second, and third, he complains of increased bilateral feet burning, numbness, especially at the ball of his feet, he continue to walk with a brace, which has been very helpful  He has been taking Cymbalta 60 mg every morning for a while, was not sure it is helping him, he denies significant side effect  He denies bilateral upper extremity symptoms, no incontinence  UPDATE Nov 11th 2015: He is receiving ivig 0.5 g/kg x2, every 3 weeks, He continue to show mild steady improvement over time, he is no longer wearing ankle brace, he now complains of low back pain, no bowel bladder incontinence  UPDATE Feb 14 2015: He no longer wear ankle brace, ambulate without assistance, complains of bilateral plantar surface pain, especially after prolonged walking In June 2016 he developed atrial fibrillation, went through cardiac conversion successfully, now he is taking anticoagulations Xelrato  He still receiving IVIG, 1 g/kg every 3 weeks  UPDATE Feb 14 2016: He continued to show mild improvement with continued IVIG treatment, the next 10% 1 g/kg (he weighed 86 kg,= use 90 g, divided into 2 doses every 3 weeks), he is getting home infusion, hope to change the schedule to every month,  He now ambulate without ankle brace, denies upper extremity involvement. He does have numbness, deep achy pain of bilateral foot. Cymbalta 60 mg has been helpful,  UPDATE November 14 2016: His overall doing very well, continued IVIG treatment 10%, 1g/kg divided into 2 days.  He complains some right hip pain, ambulate without ankle brace, still has  significant gait abnormality,  UPDATE Feb 24 2017: He is still getting IVIG 10%, 1 g/kg every month, doing very well, continued to work full-time, he does has mild gait abnormality, dragging his right leg more,  He denied loss of sense of smell, sleeps well,  UPDATE May 18 2017: He is accompanied by his son at visit, he is over all doing well, sinemet 25/100 tid has been helpful,  He reported 50% improvement.  UpDate November 12 2017: He is overall doing well, still get benefit from ivig.  He is also taking Sinemet 25/100 tid, no significant side effect noticed.  Continue have significant gait abnormality due to distal leg weakness, numbness, Cymbalta 60 mg daily has helped his neuropathic pain  11/15/2018 Manuel Sampson is a 84 year old male who is being seen today for follow-up regarding CIDP, neuropathic pain and Parkinson's.  All follow-up conditions have been stable with ongoing use of IVIG every 4 weeks, Cymbalta 60 mg daily and Sinemet 0.5 mg twice daily.  At prior visit, complains of nausea therefore decreased dose to half tab twice daily and increase to 3 times daily if able.  He continued on twice daily dosage and denies any worsening in any of his symptoms.  Continues to be ambulatory without use of assistive device and denies any recent falls.  No concerns at this time.  UPDATE November 17 2019: He continues to have significant gait abnormality, but is no longer using AFO, today he pulled out of 500 pounds drum, injured his right shoulder, he denies significant low back pain,  Reviewed [facility] visit in May 2021 following a fall, MRI of the brain reported right frontal scalp hematoma, no acute abnormality, cerebral atrophy, ventriculomegaly, slight out of proportion of atrophy,  Laboratory evaluation, hemoglobin 12.8, CMP showed no significant abnormality  He continues work full-time at his [employer], his wife passed away in 07/01/2019, he also called Covid in December 2020,  recovered well  He take his Sinemet 25/100 mg 3 times a day, seems to help his walking some  Update April 05, 2020: He was admitted to hospital in November 2021 for pneumonia, then on December [id number] for diastolic congestive heart failure, required Lasix, was seen by cardiology recently, paroxysmal atrial fibrillation, coronary artery disease, on Xarelto treatment, status post a stent,  Last [facility] infusion through [facility] ([facility], contact [place], [phone])  was in October 2021, missing November and December infusion, he was noted to have increased gait abnormality  In 2019, he was also diagnosed with Parkinson's disease, started on Sinemet 25/100 mg 3 times daily, he did have some improvement at that time,  Laboratory evaluations in March 29, 2020, creatinine 1.08, CBC showed hemoglobin of 10.9,  REVIEW OF SYSTEMS: Out of a complete 14 system review of symptoms, the patient complains only of the following symptoms, and all other reviewed systems are negative.  As above    ALLERGIES: Allergies  Allergen Reactions  . Ezetimibe     Other reaction(s): Arthralgias (intolerance)  . Fenofibrate Micronized     Other reaction(s): Arthralgias (intolerance)  . Statins     Other reaction(s): Arthralgias (intolerance)    HOME MEDICATIONS: Outpatient Medications Prior to Visit  Medication Sig Dispense Refill  . amiodarone (PACERONE) 200 MG tablet Take 200 mg by mouth 2 (two) times daily.    Marland Kitchen  carbidopa-levodopa (SINEMET IR) 25-100 MG tablet Take 1 tablet by mouth 3 (three) times daily. 270 tablet 4  . DULoxetine (CYMBALTA) 60 MG capsule Take 1 capsule (60 mg total) by mouth daily. 90 capsule 4  . Immune Globulin 10% (GAMUNEX-C) 10 GM/100ML SOLN Inject 1 g/kg into the vein every 30 (thirty) days.    Marland Kitchen lisinopril (PRINIVIL,ZESTRIL) 10 MG tablet Take 10 mg by mouth daily.    . metFORMIN (GLUCOPHAGE) 500 MG tablet Take by mouth daily.    . metoprolol tartrate  (LOPRESSOR) 25 MG tablet Take 25 mg by mouth daily.    . rivaroxaban (XARELTO) 20 MG TABS tablet Take 20 mg by mouth daily.     No facility-administered medications prior to visit.    PAST MEDICAL HISTORY: Past Medical History:  Diagnosis Date  . Abnormality of gait 07/07/2012  . CIDP (chronic inflammatory demyelinating polyneuropathy) ([place])   . CIDP (chronic inflammatory demyelinating polyneuropathy) ([place]) 07/07/2012  . Foot drop   . Gait abnormality   . Heart disease   . Irregular heartbeat   . Pre-diabetes   . Prostate cancer ([place])     PAST SURGICAL HISTORY: History reviewed. No pertinent surgical history.  FAMILY HISTORY: History reviewed. No pertinent family history.  SOCIAL HISTORY: Social History   Socioeconomic History  . Marital status: Widowed    Spouse name: Webb Silversmith  . Number of children: 2  . Years of education: Not on file  . Highest education level: Not on file  Occupational History  . Not on file  Tobacco Use  . Smoking status: Never Smoker  . Smokeless tobacco: Never Used  Substance and Sexual Activity  . Alcohol use: No    Alcohol/week: 0.0 standard drinks  . Drug use: No  . Sexual activity: Not on file  Other Topics Concern  . Not on file  Social History Narrative   Patient lives at home with his wife [name]). Patient is still working. Two children.Right handed.   Caffeine- two cups.      Social Determinants of Health   Financial Resource Strain: Not on file  Food Insecurity: Not on file  Transportation Needs: Not on file  Physical Activity: Not on file  Stress: Not on file  Social Connections: Not on file  Intimate Partner Violence: Not on file   PHYSICAL EXAM  Vitals:   04/05/20 0900  BP: (!) 160/68  Pulse: 60  Weight: 178 lb (80.7 kg)  Height: 5\' 11"  (1.803 m)   Body mass index is 24.83 kg/m.   PHYSICAL EXAMNIATION:  Gen: NAD, conversant, well nourised, well groomed                     Cardiovascular: Regular rate rhythm, no  peripheral edema, warm, nontender. Eyes: Conjunctivae clear without exudates or hemorrhage Neck: Supple, no carotid bruits. Pulmonary: Clear to auscultation bilaterally   NEUROLOGICAL EXAM:  MENTAL STATUS: Speech:    Speech is normal; fluent and spontaneous with normal comprehension.  Cognition:     Orientation to time, place and person     Normal recent and remote memory     Normal Attention span and concentration     Normal Language, naming, repeating,spontaneous speech     Fund of knowledge   CRANIAL NERVES: CN II: Visual fields are full to confrontation.  Pupils are round equal and briskly reactive to light. CN III, IV, VI: extraocular movement are normal. No ptosis. CN V: Facial sensation is intact to pinprick in all  3 divisions bilaterally. Corneal responses are intact.  CN VII: Face is symmetric with normal eye closure and smile. CN VIII: Hearing is normal to casual conversation CN IX, X: Phonation is normal. CN XI: Head turning and shoulder shrug are intact   MOTOR: Limited range of motion of right shoulder, no significant upper extremity proximal muscle weakness, mild to moderate bilateral hip flexion weakness, significant bilateral ankle dorsiflexion weakness, right 3, left 4-, plantarflexion 4/4+  He has left hand resting tremor, right more than left upper and lower extremity rigidity.  REFLEXES: Areflexia  SENSORY: Length dependent decreased vibratory sensation, pinprick to mid shin level, loss of toe proprioception  COORDINATION: Rapid alternating movements and fine finger movements are intact. There is no dysmetria on finger-to-nose and heel-knee-shin.    GAIT/STANCE: He needs push-up to get up from seated position, very unsteady, bilateral flare gait, worse on the right side, decreased bilateral arm swing, retropulsive instability  DIAGNOSTIC DATA (LABS, IMAGING, TESTING) - I reviewed patient records, labs, notes, testing and imaging myself where  available.  ASSESSMENT AND PLAN 84 y.o. year old male  Chronic inflammatory demyelinating polyneuropathy  Has been on every months IVIG 1 g/kg since 2013, through home agent, tolerating it well, continue to report improvement,  EMG nerve conduction study in August 2018 showed no significant change compared to previous study in April 2015, continued evidence of chronic demyelinating polyradicular neuropathy, with superimposed right median neuropathy, no significant axonal loss based on EMG findings  Cymbalta 60 mg for neuropathic pain  Parkinsonism  Increase Sinemet 25/100 mg to 2 tablets 3 times daily  Gait abnormality  Is taking Xarelto, no longer wearing AFO, significant distal weakness,parkinsonism, at high risk for all  Have emphasized importance of avoiding fall,  I have suggested him using walker.  Refused physical therapy  Marcial Pacas, M.D. Ph.D.  [facility] [place], [place] [address] Phone: [phone] Fax:      [phone]

## 2020-04-05 NOTE — Telephone Encounter (Signed)
Received faxed orders from [facility]) to resume the patient's IVIG. Orders have been reviewed, signed by Dr. Krista Blue and faxed back with confirmation.  Also, left message with this information for Jenny Reichmann (home health nurse). Provided our hours and number to call if needed.   [facility]: Ph: [phone] Fax: [phone]

## 2020-05-02 ENCOUNTER — Ambulatory Visit (HOSPITAL_COMMUNITY)
Admission: RE | Admit: 2020-05-02 | Discharge: 2020-05-02 | Disposition: A | Discharge status: Home / Self Care | Payer: Medicare Other | Source: Ambulatory Visit | Attending: Neurology | Admitting: Neurology

## 2020-05-02 ENCOUNTER — Encounter: Payer: Self-pay | Admitting: Neurology

## 2020-05-02 ENCOUNTER — Telehealth: Payer: Self-pay | Admitting: Neurology

## 2020-05-02 ENCOUNTER — Other Ambulatory Visit: Payer: Self-pay

## 2020-05-02 ENCOUNTER — Ambulatory Visit: Payer: Medicare Other | Admitting: Neurology

## 2020-05-02 VITALS — BP 137/57 | HR 57

## 2020-05-02 DIAGNOSIS — G6181 Chronic inflammatory demyelinating polyneuritis: Secondary | ICD-10-CM

## 2020-05-02 DIAGNOSIS — G20 Parkinson's disease: Secondary | ICD-10-CM | POA: Insufficient documentation

## 2020-05-02 DIAGNOSIS — R269 Unspecified abnormalities of gait and mobility: Secondary | ICD-10-CM | POA: Insufficient documentation

## 2020-05-02 NOTE — Progress Notes (Signed)
History of present illness: He was previously healthy, highly functional, was diagnosed with prostate cancer based on PSA elevation, had pelvic radiation since August 2012, lasting for 2 months, with seed implantation,  Around 2012, he first noticed bilateral feet numbness tingling burning achy pain, couple weeks later, he noticed gait difficulty, stumbling, right worse than left, symptoms continued to get worse, plateued within 6-7 months, he had a slight improvement over the past couple months, but still had significant gait difficulty,  He denies incontinence, no bilateral hands paresthesia, no fingertips numbness tingling, no bad weakness,  MRI of lumbar reviewed, from [facility], L4-5 right posterior lateral disc extrusion with downward turning, with right L5 encroachment, moderate bilateral foraminal stenosis  Electrodiagnostic study in November 18, 2011, showed prolonged F-wave latency of all the upper extremity motor nerves tested. Absence bilateral lower extremity motor and sensory responses, activity denervations involving bilateral distal leg muscles, consistent with demyelinating polyradiculoneuropathy   CSF showed elevated TP  72, glucose 79,  Labs, normal CMP, with exception of glucose 163, PEP,cbc, lyme titer, ssa,b, tsh,ace, ana, rpr, vit b12, crp,  He began to receive IVIG treatment from home health since Sep 2013,  1g/kg every 3 weeks, he tolerated the treatment very well, he reported significant improvement, he no longer walks with a cane, He go to church on Sunday without his ankle bracelet on sometimes. The numbness in his neck is now receding to his plantar feet only.  UPDATE June 26th 2014: He continues to show improvement with ivig, he is no longer staggering as much, walk with out assistant, wearing bilateral ankle brace, he had infusion every 3 weeks, no significant side effect noticed.  UPDATE March 6th 2015: Overall, he does very well, work 8  hours each day, he was switched from [place] to [place] since February 2015. He completed two infusions, through home health agent [facility], last infusion was March second, and third, he complains of increased bilateral feet burning, numbness, especially at the ball of his feet, he continue to walk with a brace, which has been very helpful  He has been taking Cymbalta 60 mg every morning for a while, was not sure it is helping him, he denies significant side effect  He denies bilateral upper extremity symptoms, no incontinence  UPDATE Nov 11th 2015: He is receiving ivig 0.5 g/kg x2, every 3 weeks, He continue to show mild steady improvement over time, he is no longer wearing ankle brace, he now complains of low back pain, no bowel bladder incontinence  UPDATE Feb 14 2015: He no longer wear ankle brace, ambulate without assistance, complains of bilateral plantar surface pain, especially after prolonged walking In June 2016 he developed atrial fibrillation, went through cardiac conversion successfully, now he is taking anticoagulations Xelrato  He still receiving IVIG, 1 g/kg every 3 weeks  UPDATE Feb 14 2016: He continued to show mild improvement with continued IVIG treatment, the next 10% 1 g/kg (he weighed 86 kg,= use 90 g, divided into 2 doses every 3 weeks), he is getting home infusion, hope to change the schedule to every month,  He now ambulate without ankle brace, denies upper extremity involvement. He does have numbness, deep achy pain of bilateral foot. Cymbalta 60 mg has been helpful,  UPDATE November 14 2016: His overall doing very well, continued IVIG treatment 10%, 1g/kg divided into 2 days.  He complains some right hip pain, ambulate without ankle brace, still has significant gait abnormality,  UPDATE Feb 24 2017: He is still getting IVIG 10%, 1 g/kg every month, doing very well, continued to work full-time, he does has mild gait abnormality, dragging his right leg  more,  He denied loss of sense of smell, sleeps well,  UPDATE May 18 2017: He is accompanied by his son at visit, he is over all doing well, sinemet 25/100 tid has been helpful,  He reported 50% improvement.  UpDate November 12 2017: He is overall doing well, still get benefit from ivig.  He is also taking Sinemet 25/100 tid, no significant side effect noticed.  Continue have significant gait abnormality due to distal leg weakness, numbness, Cymbalta 60 mg daily has helped his neuropathic pain  11/15/2018 JV:Mr. Schaller is a 85 year old male who is being seen today for follow-up regarding CIDP, neuropathic pain and Parkinson's.  All follow-up conditions have been stable with ongoing use of IVIG every 4 weeks, Cymbalta 60 mg daily and Sinemet 0.5 mg twice daily.  At prior visit, complains of nausea therefore decreased dose to half tab twice daily and increase to 3 times daily if able.  He continued on twice daily dosage and denies any worsening in any of his symptoms.  Continues to be ambulatory without use of assistive device and denies any recent falls.  No concerns at this time.  UPDATE November 17 2019: He continues to have significant gait abnormality, but is no longer using AFO, today he pulled out of 500 pounds drum, injured his right shoulder, he denies significant low back pain,  Reviewed [facility] visit in May 2021 following a fall, MRI of the brain reported right frontal scalp hematoma, no acute abnormality, cerebral atrophy, ventriculomegaly, slight out of proportion of atrophy,  Laboratory evaluation, hemoglobin 12.8, CMP showed no significant abnormality  He continues work full-time at his [employer], his wife passed away in 06/18/2019, he also called Covid in December 2020, recovered well  He take his Sinemet 25/100 mg 3 times a day, seems to help his walking some  Update April 05, 2020: He was admitted to hospital in November 2021 for pneumonia, then on December 93-81 for  diastolic congestive heart failure, required Lasix, was seen by cardiology recently, paroxysmal atrial fibrillation, coronary artery disease, on Xarelto treatment, status post a stent,  Last IVIG infusion [facility] ([facility], contact [place], [phone])  was in October 2021, missing November and December infusion, he was noted to have increased gait abnormality  In 2019, he was also diagnosed with Parkinson's disease, started on Sinemet 25/100 mg 3 times daily, he did have some improvement at that time,   Laboratory evaluations in March 29, 2020, creatinine 1.08, CBC showed hemoglobin of 10.9,  UPDATE May 02 2020: He is accompanied by his 2 sons at today's clinical visit, following last visit on April 05, 2020, his Sinemet dosage was increased from 25/100 mg 1 tablet 3 times a day to 2 tablets 3 times a day  On further questioning, patient was only taking Sinemet 25/100 mg half tablet twice a day, instead of titrating up the dosage, he began to take 2 tablets 3 times a day, concerning his worsening gait abnormality, he complains of significant GI side effect with higher dose, was able to go to new year trip with his family, received IVIG treatment on December 27, 28th, no significant side effect noted following infusion  Then he was noted to have slow decline gait abnormality, functional status after the trip at the beginning of January, fell  multiple times, could not get up and walk independently over the past few days,  Patient denies significant pain, no mental confusion, no bowel bladder incontinence, Sinemet dosage has decreased to previous 25/100 half tablet twice a day,  REVIEW OF SYSTEMS: Out of a complete 14 system review of symptoms, the patient complains only of the following symptoms, and all other reviewed systems are negative.  As above    ALLERGIES: Allergies  Allergen Reactions  . Ezetimibe     Other reaction(s): Arthralgias (intolerance)  .  Fenofibrate Micronized     Other reaction(s): Arthralgias (intolerance)  . Statins     Other reaction(s): Arthralgias (intolerance)    HOME MEDICATIONS: Outpatient Medications Prior to Visit  Medication Sig Dispense Refill  . amiodarone (PACERONE) 200 MG tablet Take 200 mg by mouth 2 (two) times daily.    . carbidopa-levodopa (SINEMET IR) 25-100 MG tablet Take 2 tablets by mouth 3 (three) times daily. 180 tablet 11  . DULoxetine (CYMBALTA) 60 MG capsule Take 1 capsule (60 mg total) by mouth daily. 90 capsule 4  . Immune Globulin 10% (GAMUNEX-C) 10 GM/100ML SOLN Inject 1 g/kg into the vein every 30 (thirty) days.    Marland Kitchen lisinopril (PRINIVIL,ZESTRIL) 10 MG tablet Take 10 mg by mouth daily.    . metFORMIN (GLUCOPHAGE) 500 MG tablet Take by mouth daily.    . metoprolol tartrate (LOPRESSOR) 25 MG tablet Take 25 mg by mouth daily.    . rivaroxaban (XARELTO) 20 MG TABS tablet Take 20 mg by mouth daily.     No facility-administered medications prior to visit.    PAST MEDICAL HISTORY: Past Medical History:  Diagnosis Date  . Abnormality of gait 07/07/2012  . CIDP (chronic inflammatory demyelinating polyneuropathy) ([place])   . CIDP (chronic inflammatory demyelinating polyneuropathy) ([place]) 07/07/2012  . Foot drop   . Gait abnormality   . Heart disease   . Irregular heartbeat   . Pre-diabetes   . Prostate cancer ([place])     PAST SURGICAL HISTORY: History reviewed. No pertinent surgical history.  FAMILY HISTORY: History reviewed. No pertinent family history.  SOCIAL HISTORY: Social History   Socioeconomic History  . Marital status: Widowed    Spouse name: Webb Silversmith  . Number of children: 2  . Years of education: Not on file  . Highest education level: Not on file  Occupational History  . Not on file  Tobacco Use  . Smoking status: Never Smoker  . Smokeless tobacco: Never Used  Substance and Sexual Activity  . Alcohol use: No    Alcohol/week: 0.0 standard drinks  . Drug use: No  .  Sexual activity: Not on file  Other Topics Concern  . Not on file  Social History Narrative   Patient lives at home with his wife [name]). Patient is still working. Two children.Right handed.   Caffeine- two cups.      Social Determinants of Health   Financial Resource Strain: Not on file  Food Insecurity: Not on file  Transportation Needs: Not on file  Physical Activity: Not on file  Stress: Not on file  Social Connections: Not on file  Intimate Partner Violence: Not on file   PHYSICAL EXAM  Vitals:   05/02/20 1314  BP: (!) 137/57  Pulse: (!) 57   There is no height or weight on file to calculate BMI.   PHYSICAL EXAMNIATION:  Gen: NAD, conversant, well nourised, well groomed  Cardiovascular: Regular rate rhythm, no peripheral edema, warm, nontender. Eyes: Conjunctivae clear without exudates or hemorrhage Neck: Supple, no carotid bruits. Pulmonary: Clear to auscultation bilaterally   NEUROLOGICAL EXAM:  MENTAL STATUS: Speech/cognition: Awake, alert, oriented to history taking and care conversation,   CRANIAL NERVES: CN II: Visual fields are full to confrontation.  Pupils are round equal and briskly reactive to light. CN III, IV, VI: extraocular movement are normal. No ptosis. CN V: Facial sensation is intact to pinprick in all 3 divisions bilaterally. Corneal responses are intact.  CN VII: Face is symmetric with normal eye closure and smile. CN VIII: Hearing is normal to casual conversation CN IX, X: Phonation is normal. CN XI: Head turning and shoulder shrug are intact   MOTOR: Fixation of left upper extremity on rapid rotating movement, no significant upper extremity muscle weakness, other than mild bilateral finger abduction/grip weakness, left more than right moderate rigidity increased with reinforcement maneuver  Mild bilateral hip flexion weakness, mild bilateral knee flexion, extension weakness, severe ankle dorsiflexion weakness, right  worse than left,  REFLEXES: Areflexia  SENSORY: Length dependent decreased vibratory sensation, pinprick to mid shin level, loss of toe proprioception  COORDINATION: Rapid alternating movements and fine finger movements are intact. There is no dysmetria on finger-to-nose and heel-knee-shin.    GAIT/STANCE: He needs push-up to get up from seated position, very unsteady, bilateral flare gait, worse on the right side, decreased bilateral arm swing, retropulsive instability  DIAGNOSTIC DATA (LABS, IMAGING, TESTING) - I reviewed patient records, labs, notes, testing and imaging myself where available.  ASSESSMENT AND PLAN 85 y.o. year old male  Chronic inflammatory demyelinating polyneuropathy  Has been on every months IVIG 1 g/kg since 2013, through home agent, tolerating it well, continue to report improvement,  EMG nerve conduction study in August 2018 showed no significant change compared to previous study in April 2015, continued evidence of chronic demyelinating polyradicular neuropathy, with superimposed right median neuropathy, no significant axonal loss based on EMG findings  Cymbalta 60 mg for neuropathic pain  Continue IVIG 1 g/kg every 4 weeks, last infusion was on December 27, 28 2021  Parkinsonism  Complaint side effect with higher dose of Sinemet, I have advised patient and his family to slow titrating up his Sinemet dosage ideally to 1 and half tablet 3 times a day, after meal  Worsening gait abnormality  Is taking Xarelto due to atrial fibrillation, high risk of fall, and related complications,  Emphasized importance of rely on his walker for safety,  AFO prescription  Home physical therapy  MRI of the brain to rule out stroke, history of congestive heart failure, worsening functional status following IVIG, now sleeping in recliner instead of in his bed, echocardiogram  [facility] [place], [place] [address] Phone: [phone] Fax:       [phone]

## 2020-05-02 NOTE — Telephone Encounter (Signed)
Pt unable to walk, son would like to know what Dr Krista Blue would suggest, right leg gave out on pt. Last night after dinner.

## 2020-05-02 NOTE — Telephone Encounter (Signed)
MRI Patient is having a MRI today at  3:30 [facility] per Dr. Krista Blue . Auth checked [employer] .  Raquel Sarna is out of the office I will make her of details . Thanks Hinton Dyer

## 2020-05-02 NOTE — Telephone Encounter (Signed)
I left message on both the patient and son's voicemail requesting a call back.  Also, attempted to reach his wife - no answer and not able to leave a message.

## 2020-05-02 NOTE — Patient Instructions (Signed)
Gradually build up the dose of sinemet 25/100  He is now taking Sinemet 1/2 tab three times a day,  Then one three times a day

## 2020-05-02 NOTE — Telephone Encounter (Signed)
I spoke to the patient's son who reports a decline in gait resulting in a fall. He has been unable to tolerate Sinemet IR 15-100mg , two tabs TID due to nausea and vomiting. He has only been taking 0.5 tabs BID. His son would like for him to be physically re-evaluated by Dr. Krista Blue. They have been added to her schedule today.

## 2020-05-03 ENCOUNTER — Telehealth: Payer: Self-pay | Admitting: Neurology

## 2020-05-03 NOTE — Telephone Encounter (Signed)
Noted, thank you

## 2020-05-03 NOTE — Telephone Encounter (Signed)
  IMPRESSION: Intermittently motion degraded examination as described.  No evidence of acute infarction.  Moderate cerebral atrophy has progressed as compared to the head CT of 10/31/2014.  Mild lateral and third ventriculomegaly may be related to progressive cerebral atrophy. However, there are some features to suggest normal pressure hydrocephalus and clinical correlation is recommended.  Subcentimeter foci of T2 hyperintensity within the bilateral basal ganglia are new as compared to the prior MRI of 11/29/2011 and likely reflect a combination of prominent perivascular spaces and chronic lacunar infarcts.  A small chronic lacunar infarct within the right cerebellum is also new from the prior MRI.  Stable mild periventricular chronic small vessel ischemic disease.  Paranasal sinus disease as described, most notably moderate/severe right maxillary sinus mucosal thickening.  Please call patient, MRI of the brain showed no acute abnormality, no evidence of stroke, there is some progression compared to previous MRI scan in 2013, moderate generalized atrophy, enlarged ventricle, there was also evidence of right maxillary sinusitis, please check with patient, if he has any right-sided facial pain, headache, fever, nasal discharge, if he does, he should see ENT physician,

## 2020-05-03 NOTE — Telephone Encounter (Signed)
I spoke to his son on [place], Manuel Sampson. He verbalized understanding of the MRI results. He is going to check with his father for any signs of infection. If present, he will contact ENT for an appt. Reports the patient should be getting his AFO brace this week. He knows to expect a call for the echocardiogram and home PT.

## 2020-05-21 ENCOUNTER — Ambulatory Visit: Payer: Medicare Other | Admitting: Neurology

## 2020-07-06 ENCOUNTER — Ambulatory Visit (HOSPITAL_BASED_OUTPATIENT_CLINIC_OR_DEPARTMENT_OTHER): Payer: Medicare Other | Attending: Neurology

## 2020-08-07 ENCOUNTER — Ambulatory Visit: Payer: Medicare Other | Admitting: Neurology

## 2020-08-07 ENCOUNTER — Other Ambulatory Visit: Payer: Self-pay

## 2020-08-07 ENCOUNTER — Encounter: Payer: Self-pay | Admitting: Neurology

## 2020-08-07 VITALS — BP 155/68 | HR 61 | Ht 71.0 in | Wt 180.0 lb

## 2020-08-07 DIAGNOSIS — G20 Parkinson's disease: Secondary | ICD-10-CM | POA: Diagnosis not present

## 2020-08-07 DIAGNOSIS — R4189 Other symptoms and signs involving cognitive functions and awareness: Secondary | ICD-10-CM

## 2020-08-07 DIAGNOSIS — R269 Unspecified abnormalities of gait and mobility: Secondary | ICD-10-CM

## 2020-08-07 DIAGNOSIS — G6181 Chronic inflammatory demyelinating polyneuritis: Secondary | ICD-10-CM

## 2020-08-07 NOTE — Progress Notes (Signed)
ASSESSMENT AND PLAN 85 y.o. year old male  Chronic inflammatory demyelinating polyneuropathy  Has been on every months IVIG 1 g/kg since 2013, through home agent, tolerating it well, continue to report improvement, wants to continue with IVIG infusion  EMG nerve conduction study in August 2018 showed no significant change compared to previous study in April 2015, continued evidence of chronic demyelinating polyradicular neuropathy, with superimposed right median neuropathy, no significant axonal loss based on EMG findings  Cymbalta 60 mg for neuropathic pain  Parkinsonism  Complaint side effect with higher dose of Sinemet, I have advised patient and his family to slow titrating up his Sinemet dosage ideally to 1 and half tablet 3 times a day, after meal  Worsening gait abnormality  Multifactorial, due to bilateral foot drop, and parkinsonism, brain atrophy, periventricular white matter disease  Is taking Xarelto due to atrial fibrillation, high risk of fall, and related complications,  Emphasized importance of rely on his walker for safety,  Home physical therapy   Cognitive impairment  MoCA examination is only 16/30 today,  MRI of the brain showed generalized atrophy, ventriculomegaly, much progressed compared to previous MRI scan in 2013  Laboratory evaluation to rule out treatable etiology  DIAGNOSTIC DATA (LABS, IMAGING, TESTING) - I reviewed patient records, labs, notes, testing and imaging myself where available. MRI of brain in January 2022   No evidence of acute infarction.  Moderate cerebral atrophy has progressed as compared to the head CT of 10/31/2014.  Mild lateral and third ventriculomegaly may be related to progressive cerebral atrophy. However, there are some features to suggest normal pressure hydrocephalus and clinical correlation is recommended.  Subcentimeter foci of T2 hyperintensity within the bilateral basal ganglia are new as compared to the prior  MRI of 11/29/2011 and likely reflect a combination of prominent perivascular spaces and chronic lacunar infarcts.  A small chronic lacunar infarct within the right cerebellum is also new from the prior MRI.  Stable mild periventricular chronic small vessel ischemic disease.  Paranasal sinus disease as described, most notably moderate/severe right maxillary sinus mucosal thickening. History of present illness: He was previously healthy, highly functional, was diagnosed with prostate cancer based on PSA elevation, had pelvic radiation since August 2012, lasting for 2 months, with seed implantation,  Around 2012, he first noticed bilateral feet numbness tingling burning achy pain, couple weeks later, he noticed gait difficulty, stumbling, right worse than left, symptoms continued to get worse, plateued within 6-7 months, he had a slight improvement over the past couple months, but still had significant gait difficulty,  He denies incontinence, no bilateral hands paresthesia, no fingertips numbness tingling, no bad weakness,  MRI of lumbar reviewed, [facility], L4-5 right posterior lateral disc extrusion with downward turning, with right L5 encroachment, moderate bilateral foraminal stenosis  Electrodiagnostic study in November 18, 2011, showed prolonged F-wave latency of all the upper extremity motor nerves tested. Absence bilateral lower extremity motor and sensory responses, activity denervations involving bilateral distal leg muscles, consistent with demyelinating polyradiculoneuropathy   CSF showed elevated TP  72, glucose 79,  Labs, normal CMP, with exception of glucose 163, PEP,cbc, lyme titer, ssa,b, tsh,ace, ana, rpr, vit b12, crp,  He began to receive IVIG treatment from home health since Sep 2013,  1g/kg every 3 weeks, he tolerated the treatment very well, he reported significant improvement, he no longer walks with a cane, He go to church on Sunday without his  ankle bracelet on sometimes. The numbness in his  neck is now receding to his plantar feet only.  UPDATE June 26th 2014: He continues to show improvement with ivig, he is no longer staggering as much, walk with out assistant, wearing bilateral ankle brace, he had infusion every 3 weeks, no significant side effect noticed.  UPDATE March 6th 2015: Overall, he does very well, work 8 hours each day, he was switched from [place] to [place] since February 2015. He completed two infusions, through home health agent [facility], last infusion was March second, and third, he complains of increased bilateral feet burning, numbness, especially at the ball of his feet, he continue to walk with a brace, which has been very helpful  He has been taking Cymbalta 60 mg every morning for a while, was not sure it is helping him, he denies significant side effect  He denies bilateral upper extremity symptoms, no incontinence  UPDATE Nov 11th 2015: He is receiving ivig 0.5 g/kg x2, every 3 weeks, He continue to show mild steady improvement over time, he is no longer wearing ankle brace, he now complains of low back pain, no bowel bladder incontinence  UPDATE Feb 14 2015: He no longer wear ankle brace, ambulate without assistance, complains of bilateral plantar surface pain, especially after prolonged walking In June 2016 he developed atrial fibrillation, went through cardiac conversion successfully, now he is taking anticoagulations Xelrato  He still receiving IVIG, 1 g/kg every 3 weeks  UPDATE Feb 14 2016: He continued to show mild improvement with continued IVIG treatment, the next 10% 1 g/kg (he weighed 86 kg,= use 90 g, divided into 2 doses every 3 weeks), he is getting home infusion, hope to change the schedule to every month,  He now ambulate without ankle brace, denies upper extremity involvement. He does have numbness, deep achy pain of bilateral foot. Cymbalta 60 mg has been helpful,  UPDATE  November 14 2016: His overall doing very well, continued IVIG treatment 10%, 1g/kg divided into 2 days.  He complains some right hip pain, ambulate without ankle brace, still has significant gait abnormality,  UPDATE Feb 24 2017: He is still getting IVIG 10%, 1 g/kg every month, doing very well, continued to work full-time, he does has mild gait abnormality, dragging his right leg more,  He denied loss of sense of smell, sleeps well,  UPDATE May 18 2017: He is accompanied by his son at visit, he is over all doing well, sinemet 25/100 tid has been helpful,  He reported 50% improvement.  UpDate November 12 2017: He is overall doing well, still get benefit from ivig.  He is also taking Sinemet 25/100 tid, no significant side effect noticed.  Continue have significant gait abnormality due to distal leg weakness, numbness, Cymbalta 60 mg daily has helped his neuropathic pain  11/15/2018 [name] is a 85 year old male who is being seen today for follow-up regarding CIDP, neuropathic pain and Parkinson's.  All follow-up conditions have been stable with ongoing use of IVIG every 4 weeks, Cymbalta 60 mg daily and Sinemet 0.5 mg twice daily.  At prior visit, complains of nausea therefore decreased dose to half tab twice daily and increase to 3 times daily if able.  He continued on twice daily dosage and denies any worsening in any of his symptoms.  Continues to be ambulatory without use of assistive device and denies any recent falls.  No concerns at this time.  UPDATE November 17 2019: He continues to have significant gait abnormality, but is no longer using AFO, today  he pulled out of 500 pounds drum, injured his right shoulder, he denies significant low back pain,  Reviewed [facility] visit in May 2021 following a fall, MRI of the brain reported right frontal scalp hematoma, no acute abnormality, cerebral atrophy, ventriculomegaly, slight out of proportion of atrophy,  Laboratory evaluation,  hemoglobin 12.8, CMP showed no significant abnormality  He continues work full-time at his [employer], his wife passed away in 06-25-19, he also called Covid in December 2020, recovered well  He take his Sinemet 25/100 mg 3 times a day, seems to help his walking some  Update April 05, 2020: He was admitted to hospital in November 2021 for pneumonia, then on December [id number] for diastolic congestive heart failure, required Lasix, was seen by cardiology recently, paroxysmal atrial fibrillation, coronary artery disease, on Xarelto treatment, status post a stent,  [facility] ([facility], contact [facility], [phone])  was in October 2021, missing November and December infusion, he was noted to have increased gait abnormality  In 2019, he was also diagnosed with Parkinson's disease, started on Sinemet 25/100 mg 3 times daily, he did have some improvement at that time,   Laboratory evaluations in March 29, 2020, creatinine 1.08, CBC showed hemoglobin of 10.9,  UPDATE May 02 2020: He is accompanied by his 2 sons at today's clinical visit, following last visit on April 05, 2020, his Sinemet dosage was increased from 25/100 mg 1 tablet 3 times a day to 2 tablets 3 times a day  On further questioning, patient was only taking Sinemet 25/100 mg half tablet twice a day, instead of titrating up the dosage, he began to take 2 tablets 3 times a day, concerning his worsening gait abnormality, he complains of significant GI side effect with higher dose, was able to go to new year trip with his family, received IVIG treatment on December 27, 28th, no significant side effect noted following infusion  Then he was noted to have slow decline gait abnormality, functional status after the trip at the beginning of January, fell multiple times, could not get up and walk independently over the past few days,  Patient denies significant pain, no mental confusion, no bowel  bladder incontinence, Sinemet dosage has decreased to previous 25/100 half tablet twice a day,  UPDATE August 07 2020: He is accompanied by his son at today's clinical visit, wearing bilateral AFO, ambulating better, still rely on his cane, still works full-time, drives a forklift sometimes, his son denies significant memory loss, but today's MoCA examination is only 16/30,  MRI of the brain showed significant atrophy, ventriculomegaly, much progressed compared to previous MRI in 2013  REVIEW OF SYSTEMS: Out of a complete 14 system review of symptoms, the patient complains only of the following symptoms, and all other reviewed systems are negative.  As above    ALLERGIES: Allergies  Allergen Reactions  . Ezetimibe     Other reaction(s): Arthralgias (intolerance)  . Fenofibrate Micronized     Other reaction(s): Arthralgias (intolerance)  . Statins     Other reaction(s): Arthralgias (intolerance)    HOME MEDICATIONS: Outpatient Medications Prior to Visit  Medication Sig Dispense Refill  . amiodarone (PACERONE) 200 MG tablet Take 200 mg by mouth 2 (two) times daily.    . carbidopa-levodopa (SINEMET IR) 25-100 MG tablet Take 2 tablets by mouth 3 (three) times daily. 180 tablet 11  . DULoxetine (CYMBALTA) 60 MG capsule Take 1 capsule (60 mg total) by mouth daily. [place]  capsule 4  . Immune Globulin 10% (GAMUNEX-C) 10 GM/100ML SOLN Inject 1 g/kg into the vein every 30 (thirty) days.    Marland Kitchen lisinopril (PRINIVIL,ZESTRIL) 10 MG tablet Take 10 mg by mouth daily.    . metFORMIN (GLUCOPHAGE) 500 MG tablet Take by mouth daily.    . metoprolol tartrate (LOPRESSOR) 25 MG tablet Take 25 mg by mouth daily.    . rivaroxaban (XARELTO) 20 MG TABS tablet Take 20 mg by mouth daily.     No facility-administered medications prior to visit.    PAST MEDICAL HISTORY: Past Medical History:  Diagnosis Date  . Abnormality of gait 07/07/2012  . CIDP (chronic inflammatory demyelinating polyneuropathy) (HCC)   .  CIDP (chronic inflammatory demyelinating polyneuropathy) (HCC) 07/07/2012  . Foot drop   . Gait abnormality   . Heart disease   . Irregular heartbeat   . Pre-diabetes   . Prostate cancer (HCC)     PAST SURGICAL HISTORY: No past surgical history on file.  FAMILY HISTORY: No family history on file.  SOCIAL HISTORY: Social History   Socioeconomic History  . Marital status: Widowed    Spouse name: Thurston Hole  . Number of children: 2  . Years of education: Not on file  . Highest education level: Not on file  Occupational History  . Not on file  Tobacco Use  . Smoking status: Never Smoker  . Smokeless tobacco: Never Used  Substance and Sexual Activity  . Alcohol use: No    Alcohol/week: 0.0 standard drinks  . Drug use: No  . Sexual activity: Not on file  Other Topics Concern  . Not on file  Social History Narrative   Patient lives at home with his wife Dewayne Hatch). Patient is still working. Two children.Right handed.   Caffeine- two cups.      Social Determinants of Health   Financial Resource Strain: Not on file  Food Insecurity: Not on file  Transportation Needs: Not on file  Physical Activity: Not on file  Stress: Not on file  Social Connections: Not on file  Intimate Partner Violence: Not on file   PHYSICAL EXAM  Vitals:   08/07/20 1048  Weight: 180 lb (81.6 kg)  Height: 5\' 11"  (1.803 m)   Body mass index is 25.1 kg/m.   PHYSICAL EXAMNIATION:  Gen: NAD, conversant, well nourised, well groomed                     Cardiovascular: Regular rate rhythm, no peripheral edema, warm, nontender. Eyes: Conjunctivae clear without exudates or hemorrhage Neck: Supple, no carotid bruits. Pulmonary: Clear to auscultation bilaterally   NEUROLOGICAL EXAM:  MENTAL STATUS: Montreal Cognitive Assessment  08/07/2020  Visuospatial/ Executive (0/5) 0  Naming (0/3) 2  Attention: Read list of digits (0/2) 2  Attention: Read list of letters (0/1) 1  Attention: Serial 7 subtraction  starting at 100 (0/3) 2  Language: Repeat phrase (0/2) 2  Language : Fluency (0/1) 0  Abstraction (0/2) 2  Delayed Recall (0/5) 0  Orientation (0/6) 5  Total 16   CRANIAL NERVES: CN II: Visual fields are full to confrontation.  Pupils are round equal and briskly reactive to light. CN III, IV, VI: extraocular movement are normal. No ptosis. CN V: Facial sensation is intact to pinprick in all 3 divisions bilaterally. Corneal responses are intact.  CN VII: Face is symmetric with normal eye closure and smile. CN VIII: Hearing is normal to casual conversation CN IX, X: Phonation is normal. CN  XI: Head turning and shoulder shrug are intact   MOTOR: Fixation of left upper extremity on rapid rotating movement, no significant upper extremity muscle weakness, other than mild bilateral finger abduction/grip weakness, left more than right moderate rigidity increased with reinforcement maneuver  Mild bilateral hip flexion weakness, mild bilateral knee flexion, extension weakness, severe ankle dorsiflexion weakness, right worse than left,  REFLEXES: Areflexia  SENSORY: Length dependent decreased vibratory sensation, pinprick to mid shin level, loss of toe proprioception  COORDINATION: Rapid alternating movements and fine finger movements are intact. There is no dysmetria on finger-to-nose and heel-knee-shin.    GAIT/STANCE: He needs push-up to get up from seated position, very unsteady, bilateral flare gait, worse on the right side, decreased bilateral arm swing, retropulsive instability   [facility] [place], [place] [address] Phone: [phone] Fax:      [phone]

## 2020-08-08 ENCOUNTER — Telehealth: Payer: Self-pay | Admitting: Neurology

## 2020-08-08 LAB — COMPREHENSIVE METABOLIC PANEL
ALT: 7 IU/L (ref 0–44)
AST: 23 IU/L (ref 0–40)
Albumin/Globulin Ratio: 1.1 — ABNORMAL LOW (ref 1.2–2.2)
Albumin: 3.9 g/dL (ref 3.6–4.6)
Alkaline Phosphatase: 84 IU/L (ref 44–121)
BUN/Creatinine Ratio: 20 (ref 10–24)
BUN: 23 mg/dL (ref 8–27)
Bilirubin Total: 0.3 mg/dL (ref 0.0–1.2)
CO2: 25 mmol/L (ref 20–29)
Calcium: 9.4 mg/dL (ref 8.6–10.2)
Chloride: 98 mmol/L (ref 96–106)
Creatinine, Ser: 1.16 mg/dL (ref 0.76–1.27)
Globulin, Total: 3.5 g/dL (ref 1.5–4.5)
Glucose: 105 mg/dL — ABNORMAL HIGH (ref 65–99)
Potassium: 4.4 mmol/L (ref 3.5–5.2)
Sodium: 135 mmol/L (ref 134–144)
Total Protein: 7.4 g/dL (ref 6.0–8.5)
eGFR: 63 mL/min/{1.73_m2} (ref >59)

## 2020-08-08 LAB — TSH: TSH: 54.9 u[IU]/mL — ABNORMAL HIGH (ref 0.450–4.500)

## 2020-08-08 LAB — RPR: RPR Ser Ql: NONREACTIVE

## 2020-08-08 LAB — VITAMIN B12: Vitamin B-12: 430 pg/mL (ref 232–1245)

## 2020-08-08 LAB — HGB A1C W/O EAG: Hgb A1c MFr Bld: 6.6 % — ABNORMAL HIGH (ref 4.8–5.6)

## 2020-08-08 NOTE — Telephone Encounter (Signed)
Please call patient, laboratory evaluation showed significantly elevated TSH 54.9, indicating hypothyroidism, elevated A1c 6.6 consistent with diagnosis of diabetes  I have forwarded the laboratory result to his primary care Futrell, Gildardo Griffes., MD   He should contact Dr. Avel Peace, Gildardo Griffes., MD  immediate for further evaluation and treatment

## 2020-08-08 NOTE — Telephone Encounter (Signed)
I spoke to his son, Yanis Larin (on [place]), and provided him with the lab results. He verbalized understanding of the findings and will call his PCP today to schedule an appt to address these concerns.

## 2020-12-27 ENCOUNTER — Other Ambulatory Visit: Payer: Self-pay | Admitting: Neurology

## 2021-02-18 ENCOUNTER — Ambulatory Visit: Payer: Medicare Other | Admitting: Neurology

## 2021-04-30 ENCOUNTER — Ambulatory Visit: Payer: Medicare Other | Admitting: Neurology

## 2021-04-30 ENCOUNTER — Encounter: Payer: Self-pay | Admitting: Neurology

## 2021-04-30 VITALS — BP 149/67 | HR 63 | Ht 71.0 in | Wt 181.5 lb

## 2021-04-30 DIAGNOSIS — G6181 Chronic inflammatory demyelinating polyneuritis: Secondary | ICD-10-CM | POA: Diagnosis not present

## 2021-04-30 DIAGNOSIS — G20 Parkinson's disease: Secondary | ICD-10-CM | POA: Diagnosis not present

## 2021-04-30 DIAGNOSIS — M792 Neuralgia and neuritis, unspecified: Secondary | ICD-10-CM

## 2021-04-30 DIAGNOSIS — R269 Unspecified abnormalities of gait and mobility: Secondary | ICD-10-CM

## 2021-04-30 MED ORDER — DULOXETINE HCL 60 MG PO CPEP: 60.0000 mg | ORAL_CAPSULE | Freq: Every day | ORAL | 3 refills | Status: AC

## 2021-04-30 MED ORDER — CARBIDOPA-LEVODOPA 25-100 MG PO TABS: 2.0000 | ORAL_TABLET | Freq: Three times a day (TID) | ORAL | 11 refills | Status: AC

## 2021-04-30 NOTE — Patient Instructions (Signed)
Meds ordered this encounter  Medications   carbidopa-levodopa (SINEMET IR) 25-100 MG tablet    Sig: Take 2 tablets by mouth 3 (three) times daily.    Dispense:  180 tablet    Refill:  11   DULoxetine (CYMBALTA) 60 MG capsule    Sig: Take 1 capsule (60 mg total) by mouth daily.    Dispense:  90 capsule    Refill:  3

## 2021-04-30 NOTE — Progress Notes (Signed)
ASSESSMENT AND PLAN 86 y.o. year old male  Chronic inflammatory demyelinating polyneuropathy  Has been on every months IVIG 1 g/kg since 2013, through home agent, tolerating it well, continue to report improvement, wants to continue with IVIG infusion  EMG nerve conduction study in August 2018 showed no significant change compared to previous study in April 2015, continued evidence of chronic demyelinating polyradicular neuropathy, with superimposed right median neuropathy, no significant axonal loss based on EMG findings  Cymbalta 60 mg for neuropathic pain  Parkinsonism  Difficulty keeping up with since Sinemet schedule, 25/100 mg now only taking twice a day, significant parkinsonian features, will gradually titrating up to 2 tablets 3 times a day,   Worsening gait abnormality  Multifactorial, due to bilateral foot drop, parkinsonism, brain atrophy, periventricular white matter disease  Is taking Xarelto due to atrial fibrillation, high risk of fall, and related complications,  Emphasized importance of rely on his walker for safety,    Cognitive impairment  MoCA examination was 16 out of 30 in April 2022   MRI of the brain showed generalized atrophy, ventriculomegaly, much progressed compared to previous MRI scan in 2013  Laboratory evaluation showed normal RPR, B12, significantly elevated TSH, I have forwarded lab to his primary care physician, Avel Peace, Gildardo Griffes., MD  need to be on supplement for hypothyroidism  DIAGNOSTIC DATA (LABS, IMAGING, TESTING) - I reviewed patient records, labs, notes, testing and imaging myself where available. MRI of brain in January 2022   No evidence of acute infarction.   Moderate cerebral atrophy has progressed as compared to the head CT of 10/31/2014.   Mild lateral and third ventriculomegaly may be related to progressive cerebral atrophy. However, there are some features to suggest normal pressure hydrocephalus and clinical correlation  is recommended.   Subcentimeter foci of T2 hyperintensity within the bilateral basal ganglia are new as compared to the prior MRI of 11/29/2011 and likely reflect a combination of prominent perivascular spaces and chronic lacunar infarcts.   A small chronic lacunar infarct within the right cerebellum is also new from the prior MRI.   Stable mild periventricular chronic small vessel ischemic disease.   Paranasal sinus disease as described, most notably moderate/severe right maxillary sinus mucosal thickening. History of present illness: He was previously healthy, highly functional, was diagnosed with prostate cancer based on PSA elevation, had pelvic radiation since August 2012, lasting for 2 months, with seed implantation,   Around 2012, he first noticed bilateral feet numbness tingling burning achy pain, couple weeks later, he noticed gait difficulty, stumbling, right worse than left, symptoms continued to get worse, plateued within 6-7 months, he had a slight improvement over the past couple months, but still had significant gait difficulty,   He denies incontinence, no bilateral hands paresthesia, no fingertips numbness tingling, no bad weakness,   MRI of lumbar reviewed, [facility], L4-5 right posterior lateral disc extrusion with downward turning, with right L5 encroachment, moderate bilateral foraminal stenosis   Electrodiagnostic study in November 18, 2011, showed prolonged F-wave latency of all the upper extremity motor nerves tested. Absence bilateral lower extremity motor and sensory responses, activity denervations involving bilateral distal leg muscles, consistent with demyelinating polyradiculoneuropathy   CSF showed elevated TP  72, glucose 79,   Labs, normal CMP, with exception of glucose 163, PEP,cbc, lyme titer, ssa,b, tsh,ace, ana, rpr, vit b12, crp,   He began to receive IVIG treatment from home health since Sep 2013,  1g/kg every 3 weeks, he tolerated  the treatment very well, he reported significant improvement, he no longer walks with a cane, He go to church on Sunday without his ankle bracelet on sometimes. The numbness in his neck is now receding to his plantar feet only.   UPDATE June 26th 2014: He continues to show improvement with ivig, he is no longer staggering as much, walk with out assistant, wearing bilateral ankle brace, he had infusion every 3 weeks, no significant side effect noticed.   UPDATE March 6th 2015: Overall, he does very well, work 8 hours each day, he was switched from [place] to [place] since February 2015. He completed two infusions, through home health agent [facility], last infusion was March second, and third, he complains of increased bilateral feet burning, numbness, especially at the ball of his feet, he continue to walk with a brace, which has been very helpful   He has been taking Cymbalta 60 mg every morning for a while, was not sure it is helping him, he denies significant side effect   He denies bilateral upper extremity symptoms, no incontinence   UPDATE Nov 11th 2015: He is receiving ivig 0.5 g/kg x2, every 3 weeks, He continue to show mild steady improvement over time, he is no longer wearing ankle brace, he now complains of low back pain, no bowel bladder incontinence   UPDATE Feb 14 2015: He no longer wear ankle brace, ambulate without assistance, complains of bilateral plantar surface pain, especially after prolonged walking In June 2016 he developed atrial fibrillation, went through cardiac conversion successfully, now he is taking anticoagulations Xelrato   He still receiving IVIG, 1 g/kg every 3 weeks   UPDATE Feb 14 2016: He continued to show mild improvement with continued IVIG treatment, the next 10% 1 g/kg (he weighed 86 kg,= use 90 g, divided into 2 doses every 3 weeks), he is getting home infusion, hope to change the schedule to every month,   He now ambulate without ankle brace, denies  upper extremity involvement. He does have numbness, deep achy pain of bilateral foot. Cymbalta 60 mg has been helpful,   UPDATE November 14 2016: His overall doing very well, continued IVIG treatment 10%, 1g/kg divided into 2 days.   He complains some right hip pain, ambulate without ankle brace, still has significant gait abnormality,   UPDATE Feb 24 2017: He is still getting IVIG 10%, 1 g/kg every month, doing very well, continued to work full-time, he does has mild gait abnormality, dragging his right leg more,   He denied loss of sense of smell, sleeps well,   UPDATE May 18 2017: He is accompanied by his son at visit, he is over all doing well, sinemet 25/100 tid has been helpful,  He reported 50% improvement.   UpDate November 12 2017: He is overall doing well, still get benefit from ivig.  He is also taking Sinemet 25/100 tid, no significant side effect noticed.  Continue have significant gait abnormality due to distal leg weakness, numbness, Cymbalta 60 mg daily has helped his neuropathic pain  11/15/2018 JV:Mr. Fallen is a 86 year old male who is being seen today for follow-up regarding CIDP, neuropathic pain and Parkinson's.  All follow-up conditions have been stable with ongoing use of IVIG every 4 weeks, Cymbalta 60 mg daily and Sinemet 0.5 mg twice daily.  At prior visit, complains of nausea therefore decreased dose to half tab twice daily and increase to 3 times daily if able.  He continued on twice daily dosage and denies any  worsening in any of his symptoms.  Continues to be ambulatory without use of assistive device and denies any recent falls.  No concerns at this time.  UPDATE November 17 2019: He continues to have significant gait abnormality, but is no longer using AFO, today he pulled out of 500 pounds drum, injured his right shoulder, he denies significant low back pain,  Reviewed [facility] visit in May 2021 following a fall, MRI of the brain reported right frontal scalp hematoma,  no acute abnormality, cerebral atrophy, ventriculomegaly, slight out of proportion of atrophy,  Laboratory evaluation, hemoglobin 12.8, CMP showed no significant abnormality  He continues work full-time at his [employer], his wife passed away in 2019-07-03, he also called Covid in December 2020, recovered well  He take his Sinemet 25/100 mg 3 times a day, seems to help his walking some  Update April 05, 2020: He was admitted to hospital in November 2021 for pneumonia, then on December 95-09 for diastolic congestive heart failure, required Lasix, was seen by cardiology recently, paroxysmal atrial fibrillation, coronary artery disease, on Xarelto treatment, status post a stent,  [facility]IG infusion [facility] ([facility], contact [place], [phone])  was in October 2021, missing November and December infusion, he was noted to have increased gait abnormality  In 02-Jul-2017, he was also diagnosed with Parkinson's disease, started on Sinemet 25/100 mg 3 times daily, he did have some improvement at that time,   Laboratory evaluations in March 29, 2020, creatinine 1.08, CBC showed hemoglobin of 10.9,  UPDATE May 02 2020: He is accompanied by his 2 sons at today's clinical visit, following last visit on April 05, 2020, his Sinemet dosage was increased from 25/100 mg 1 tablet 3 times a day to 2 tablets 3 times a day  On further questioning, patient was only taking Sinemet 25/100 mg half tablet twice a day, instead of titrating up the dosage, he began to take 2 tablets 3 times a day, concerning his worsening gait abnormality, he complains of significant GI side effect with higher dose, was able to go to new year trip with his family, received IVIG treatment on December 27, 28th, no significant side effect noted following infusion  Then he was noted to have slow decline gait abnormality, functional status after the trip at the beginning of January, fell multiple times, could not  get up and walk independently over the past few days,  Patient denies significant pain, no mental confusion, no bowel bladder incontinence, Sinemet dosage has decreased to previous 25/100 half tablet twice a day,  UPDATE August 07 2020: He is accompanied by his son at today's clinical visit, wearing bilateral AFO, ambulating better, still rely on his cane, still works full-time, drives a forklift sometimes, his son denies significant memory loss, but today's MoCA examination is only 16/30,  MRI of the brain showed significant atrophy, ventriculomegaly, much progressed compared to previous MRI in Jul 03, 2011  Update April 30, 2021, He is accompanied by his son Marden Noble at today's visit, continue to work full-time at his own [employer], educational, ambulate with significant difficulty, only takes Sinemet 25/100 mg 2 tablets a day, MoCA examination in April 2022 was only 16 out of 30  Laboratory evaluation showed significantly elevated TSH 55, indicating hypothyroidism, there was no thyroid supplement on medication list, will forward the labs to his primary care [place], Gildardo Griffes., MD again to address the issue.  Also evidence of A1c 6.6, consistent with diagnosis of diabetes  He reports some  improvement with IVIG, hope to continue,  REVIEW OF SYSTEMS: Out of a complete 14 system review of symptoms, the patient complains only of the following symptoms, and all other reviewed systems are negative.  As above    ALLERGIES: Allergies  Allergen Reactions   Ezetimibe     Other reaction(s): Arthralgias (intolerance)   Fenofibrate Micronized     Other reaction(s): Arthralgias (intolerance)   Statins     Other reaction(s): Arthralgias (intolerance)    HOME MEDICATIONS: Outpatient Medications Prior to Visit  Medication Sig Dispense Refill   amiodarone (PACERONE) 200 MG tablet Take 200 mg by mouth 2 (two) times daily.     carbidopa-levodopa (SINEMET IR) 25-100 MG tablet Take 2 tablets by mouth 3 (three) times  daily. 180 tablet 11   DULoxetine (CYMBALTA) 60 MG capsule TAKE 1 CAPSULE BY MOUTH EVERY DAY 90 capsule 4   furosemide (LASIX) 20 MG tablet as needed. Increase in weight of 2-3 lbs overnight     Immune Globulin 10% (GAMUNEX-C) 10 GM/100ML SOLN Inject 1 g/kg into the vein every 30 (thirty) days.     lisinopril (PRINIVIL,ZESTRIL) 10 MG tablet Take 10 mg by mouth daily.     metFORMIN (GLUCOPHAGE) 500 MG tablet Take by mouth daily.     metoprolol tartrate (LOPRESSOR) 25 MG tablet Take 25 mg by mouth daily.     olmesartan (BENICAR) 40 MG tablet Take 40 mg by mouth daily.     rivaroxaban (XARELTO) 20 MG TABS tablet Take 20 mg by mouth daily.     No facility-administered medications prior to visit.    PAST MEDICAL HISTORY: Past Medical History:  Diagnosis Date   Abnormality of gait 07/07/2012   CIDP (chronic inflammatory demyelinating polyneuropathy) (H[facility])    CIDP (chronic inflammatory demyelinating polyneuropathy) ([place]) 07/07/2012   Foot drop    Gait abnormality    Heart disease    Irregular heartbeat    Pre-diabetes    Prostate cancer ([place])     PAST SURGICAL HISTORY: History reviewed. No pertinent surgical history.  FAMILY HISTORY: History reviewed. No pertinent family history.  SOCIAL HISTORY: Social History   Socioeconomic History   Marital status: Widowed    Spouse name: Webb Silversmith   Number of children: 2   Years of education: Not on file   Highest education level: Not on file  Occupational History   Not on file  Tobacco Use   Smoking status: Never   Smokeless tobacco: Never  Substance and Sexual Activity   Alcohol use: No    Alcohol/week: 0.0 standard drinks   Drug use: No   Sexual activity: Not on file  Other Topics Concern   Not on file  Social History Narrative   Patient lives alone. His son stays with him at night. He is still working. Two children.Right handed.   Caffeine- two cups.      Social Determinants of Health   Financial Resource Strain: Not on file   Food Insecurity: Not on file  Transportation Needs: Not on file  Physical Activity: Not on file  Stress: Not on file  Social Connections: Not on file  Intimate Partner Violence: Not on file   PHYSICAL EXAM  Vitals:   04/30/21 1416  BP: (!) 149/67  Pulse: 63  Weight: 181 lb 8 oz (82.3 kg)  Height: 5\' 11"  (1.803 m)   Body mass index is 25.31 kg/m.   PHYSICAL EXAMNIATION:  Gen: NAD, conversant, well nourised, well groomed  Cardiovascular: Regular rate rhythm, no peripheral edema, warm, nontender. Eyes: Conjunctivae clear without exudates or hemorrhage Neck: Supple, no carotid bruits. Pulmonary: Clear to auscultation bilaterally   NEUROLOGICAL EXAM:  MENTAL STATUS: Montreal Cognitive Assessment  08/07/2020  Visuospatial/ Executive (0/5) 0  Naming (0/3) 2  Attention: Read list of digits (0/2) 2  Attention: Read list of letters (0/1) 1  Attention: Serial 7 subtraction starting at 100 (0/3) 2  Language: Repeat phrase (0/2) 2  Language : Fluency (0/1) 0  Abstraction (0/2) 2  Delayed Recall (0/5) 0  Orientation (0/6) 5  Total 16   CRANIAL NERVES: CN II: Visual fields are full to confrontation.  Pupils are round equal and briskly reactive to light. CN III, IV, VI: extraocular movement are normal. No ptosis. CN V: Facial sensation is intact to pinprick in all 3 divisions bilaterally. Corneal responses are intact.  CN VII: Face is symmetric with normal eye closure and smile. CN VIII: Hearing is normal to casual conversation CN IX, X: Phonation is normal. CN XI: Head turning and shoulder shrug are intact   MOTOR: Fixation of left upper extremity on rapid rotating movement, no significant upper extremity muscle weakness, other than mild bilateral finger abduction/grip weakness, left more than right moderate rigidity bradykinesia, increased with reinforcement maneuver,  Mild bilateral hip flexion weakness, mild bilateral knee flexion, extension weakness,  severe ankle dorsiflexion weakness, right worse than left,  REFLEXES: Areflexia  SENSORY: Length dependent decreased vibratory sensation, pinprick to mid shin level, loss of toe proprioception  COORDINATION: Rapid alternating movements and fine finger movements are intact. There is no dysmetria on finger-to-nose and heel-knee-shin.    GAIT/STANCE: He needs help to get up from seated position, very unsteady, wear bilateral AFO, unsteady  Marcial Pacas, M.D. Ph.D.  [facility] [place], [place] [address] Phone: [phone] Fax:      [phone]    .

## 2021-06-20 IMAGING — MR MR HEAD W/O CM
12 of 13 series · 44 of 48 positions shown · non-contrast
Comparison: Head CT 10/31/2014.  Brain MRI 11/29/2011.

CLINICAL DATA: Chronic inflammatory demyelinating polyneuropathy.
Parkinson's disease. Gait abnormality. Neuro deficit, acute, stroke
suspected.

EXAM:
MRI HEAD WITHOUT CONTRAST
TECHNIQUE: Multiplanar, multiecho pulse sequences of the brain and surrounding
structures were obtained without intravenous contrast.

[Series 5: DWI · axial · 3.0mm · 0.88mm/px · z∈[-59,+99]mm · 8 of 108 slices shown (1 of 4)]
[im 1/108]
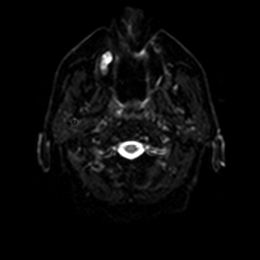
[im 16/108]
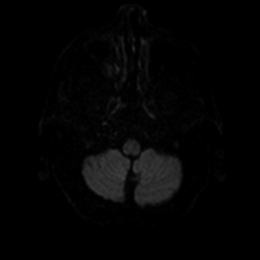
[im 31/108]
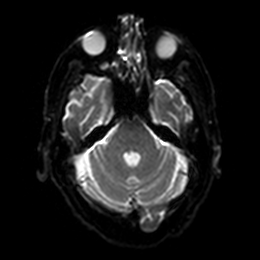
[im 46/108]
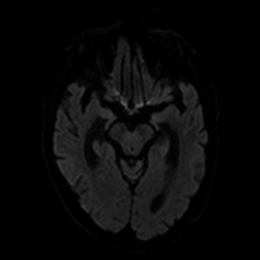
[im 62/108]
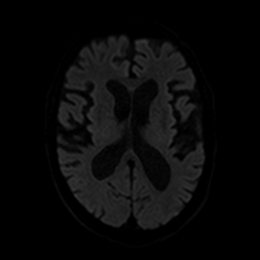
[im 77/108]
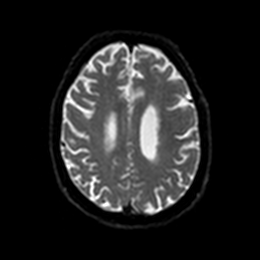
[im 92/108]
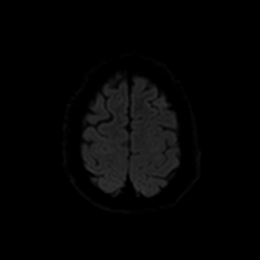
[im 108/108]
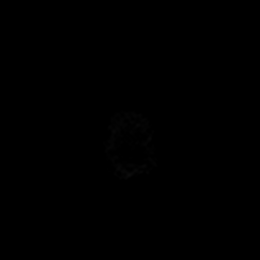

[Series 6: DWI · axial · 3.0mm · 0.88mm/px · z∈[-59,+99]mm · 4 of 54 slices shown (2 of 4)]
[im 1/54]
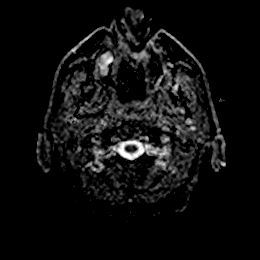
[im 18/54]
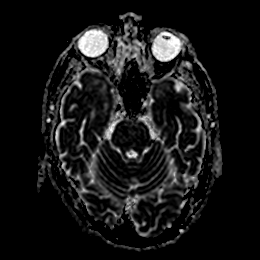
[im 36/54]
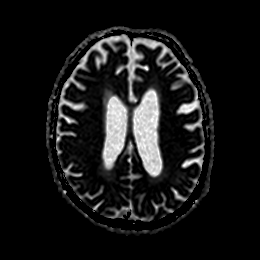
[im 54/54]
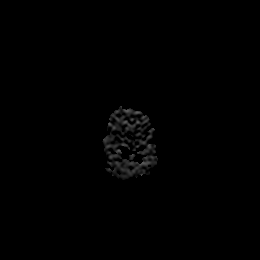

[Series 7: DWI · coronal · 4.0mm · 0.88mm/px · 5 of 76 slices shown (3 of 4)]
[im 1/76]
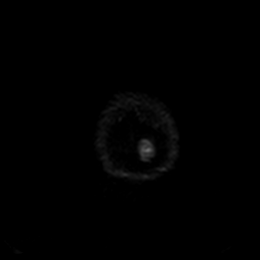
[im 19/76]
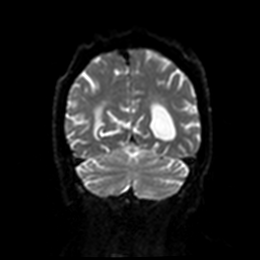
[im 38/76]
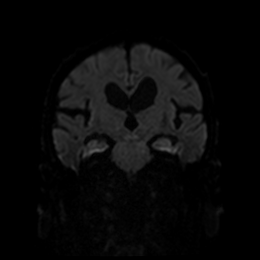
[im 57/76]
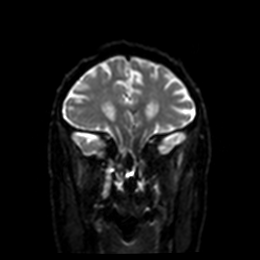
[im 76/76]
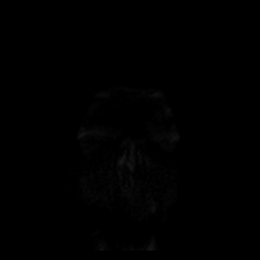

[Series 8: DWI · coronal · 4.0mm · 0.88mm/px · 3 of 38 slices shown (4 of 4)]
[im 1/38]
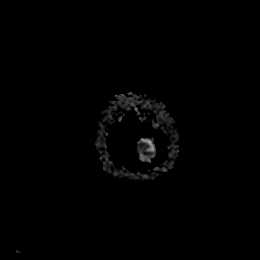
[im 19/38]
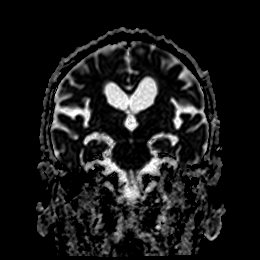
[im 38/38]
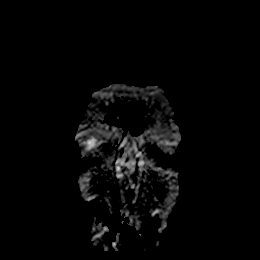

[Series 9: T1 · sagittal · 5.0mm · 0.78mm/px · 2 of 23 slices shown]
[im 1/23]
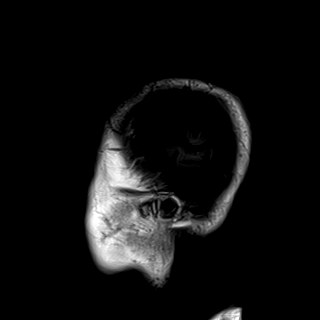
[im 23/23]
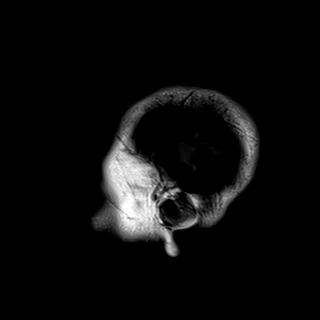

[Series 10: T2 · axial · 5.0mm · 0.75mm/px · z∈[-57,+87]mm · 2 of 25 slices shown (1 of 2)]
[im 1/25]
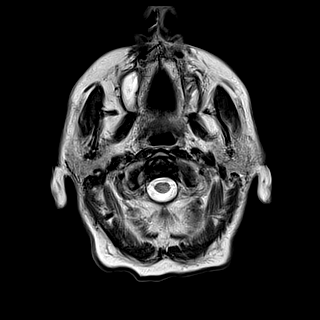
[im 25/25]
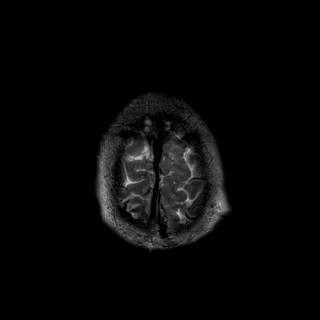

[Series 11: FLAIR · axial · 5.0mm · 0.45mm/px · z∈[-56,+87]mm · 2 of 25 slices shown]
[im 1/25]
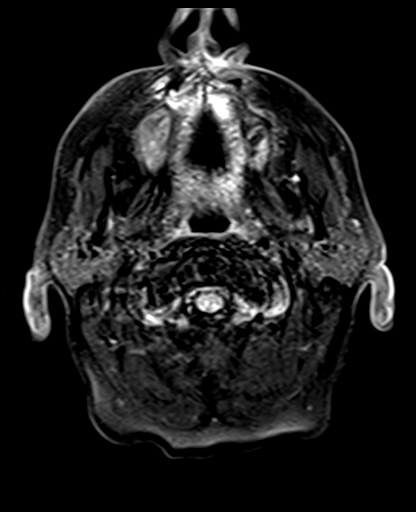
[im 25/25]
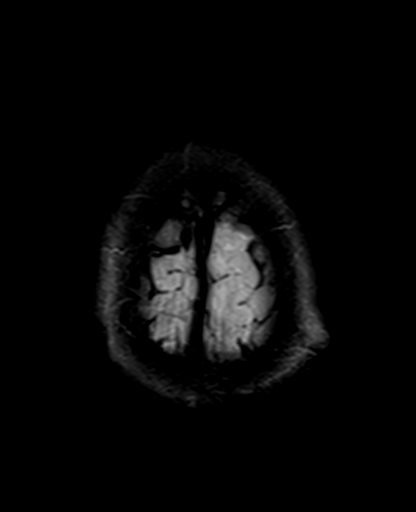

[Series 12: mag_images · axial · 3.0mm · 0.94mm/px · z∈[-62,+114]mm · 4 of 60 slices shown]
[im 1/60]
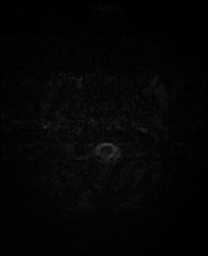
[im 20/60]
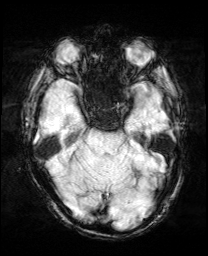
[im 40/60]
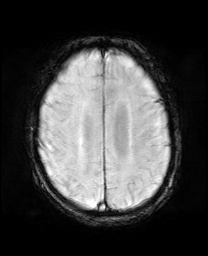
[im 60/60]
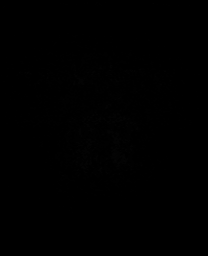

[Series 13: pha_images · axial · 3.0mm · 0.94mm/px · z∈[-62,+106]mm · 4 of 57 slices shown]
[im 1/57]
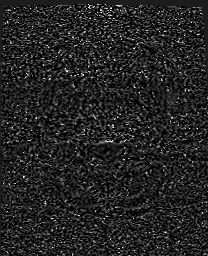
[im 19/57]
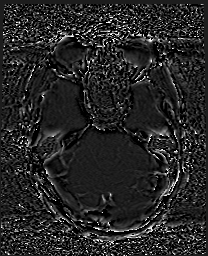
[im 38/57]
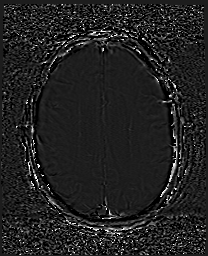
[im 57/57]
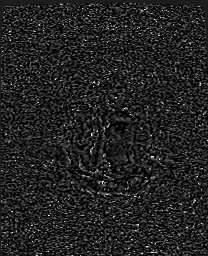

[Series 14: swi_images · axial · 3.0mm · 0.94mm/px · z∈[-62,+114]mm · 4 of 60 slices shown]
[im 1/60]
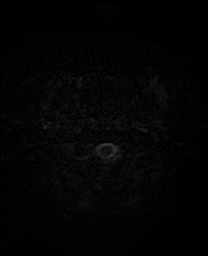
[im 20/60]
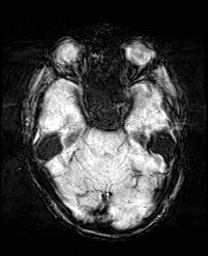
[im 40/60]
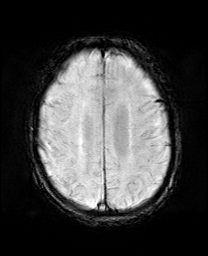
[im 60/60]
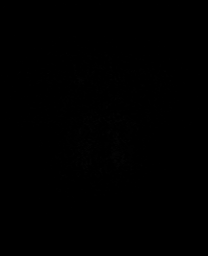

[Series 15: mip_images(sw) · axial · 24.0mm · 0.94mm/px · z∈[-51,+104]mm · 4 of 53 slices shown]
[im 1/53]
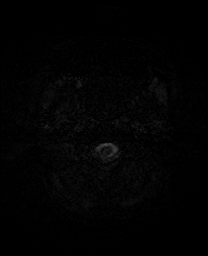
[im 18/53]
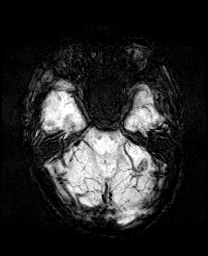
[im 35/53]
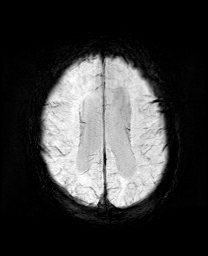
[im 53/53]
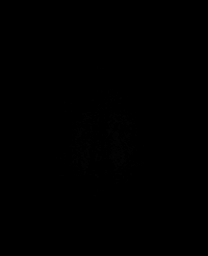

[Series 17: T2 · coronal · 5.0mm · 0.34mm/px · 2 of 29 slices shown (2 of 2)]
[im 1/29]
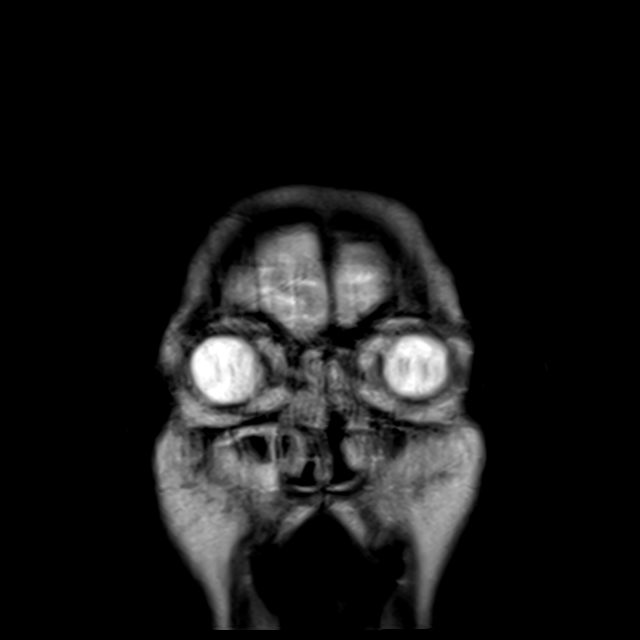
[im 29/29]
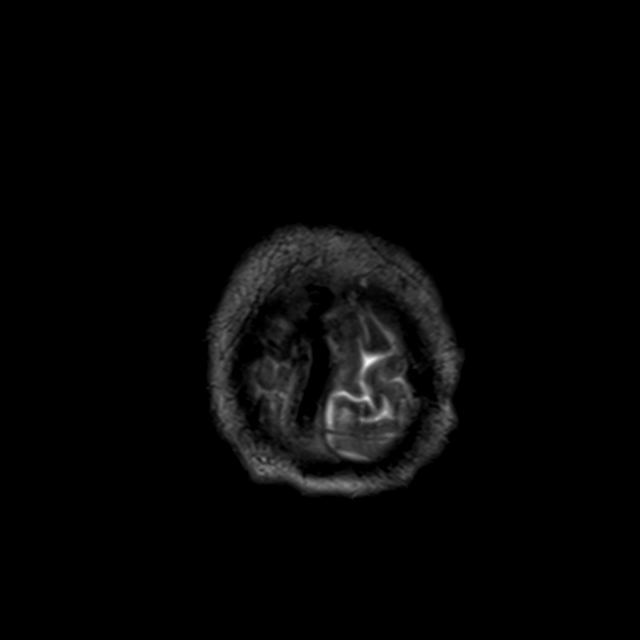

[44 of 48 positions shown; findings below may reference images not displayed]

FINDINGS: Brain:

The examination is intermittently motion degraded. Most notably,
there is moderate motion degradation of the coronal T2 weighted
sequence.

Moderate cerebral atrophy. Comparatively mild cerebellar atrophy.
Mild lateral and third ventriculomegaly. There is a decreased
callosal angle measuring 73 degrees.

Subcentimeter T2 hyperintense foci within the bilateral basal
ganglia likely reflect a combination of prominent perivascular
spaces and chronic lacunar infarcts. Findings are new as compared to
the brain MRI of 11/29/2011.

As before, there is mild periventricular white matter T2/FLAIR
hyperintensity which is nonspecific but most often related to
chronic small vessel ischemia.

Small chronic infarct within the right cerebellum, new from the
prior MRI.

There is no acute infarct.

No evidence of intracranial mass.

No chronic intracranial blood products.

No extra-axial fluid collection.

No midline shift.

Vascular: Expected proximal arterial flow voids.

Skull and upper cervical spine: No focal marrow lesion.

Sinuses/Orbits: Visualized orbits show no acute finding. Moderate to
moderately severe mucosal thickening within the right maxillary
sinus. Mild mucosal thickening within the left maxillary sinus.
Moderate bilateral ethmoid sinus mucosal thickening.
IMPRESSION: Intermittently motion degraded examination as described.

No evidence of acute infarction.

Moderate cerebral atrophy has progressed as compared to the head CT
of 10/31/2014.

Mild lateral and third ventriculomegaly may be related to
progressive cerebral atrophy. However, there are some features to
suggest normal pressure hydrocephalus and clinical correlation is
recommended.

Subcentimeter foci of T2 hyperintensity within the bilateral basal
ganglia are new as compared to the prior MRI of 11/29/2011 and
likely reflect a combination of prominent perivascular spaces and
chronic lacunar infarcts.

A small chronic lacunar infarct within the right cerebellum is also
new from the prior MRI.

Stable mild periventricular chronic small vessel ischemic disease.

Paranasal sinus disease as described, most notably moderate/severe
right maxillary sinus mucosal thickening.

## 2021-10-24 NOTE — Progress Notes (Deleted)
ASSESSMENT AND PLAN 86 y.o. year old male  Chronic inflammatory demyelinating polyneuropathy  Has been on every months IVIG 1 g/kg since 2013, through home agent, tolerating it well, continue to report improvement, wants to continue with IVIG infusion  EMG nerve conduction study in August 2018 showed no significant change compared to previous study in April 2015, continued evidence of chronic demyelinating polyradicular neuropathy, with superimposed right median neuropathy, no significant axonal loss based on EMG findings  Cymbalta 60 mg for neuropathic pain  Parkinsonism  Difficulty keeping up with since Sinemet schedule, 25/100 mg now only taking twice a day, significant parkinsonian features, will gradually titrating up to 2 tablets 3 times a day,   Worsening gait abnormality  Multifactorial, due to bilateral foot drop, parkinsonism, brain atrophy, periventricular white matter disease  Is taking Xarelto due to atrial fibrillation, high risk of fall, and related complications,  Emphasized importance of rely on his walker for safety,    Cognitive impairment  MoCA examination was 16 out of 30 in April 2022   MRI of the brain showed generalized atrophy, ventriculomegaly, much progressed compared to previous MRI scan in 2013  Laboratory evaluation showed normal RPR, B12, significantly elevated TSH, I have forwarded lab to his primary care physician, Avel Peace, Gildardo Griffes., MD  need to be on supplement for hypothyroidism  DIAGNOSTIC DATA (LABS, IMAGING, TESTING) - I reviewed patient records, labs, notes, testing and imaging myself where available. MRI of brain in January 2022   No evidence of acute infarction.   Moderate cerebral atrophy has progressed as compared to the head CT of 10/31/2014.   Mild lateral and third ventriculomegaly may be related to progressive cerebral atrophy. However, there are some features to suggest normal pressure hydrocephalus and clinical correlation  is recommended.   Subcentimeter foci of T2 hyperintensity within the bilateral basal ganglia are new as compared to the prior MRI of 11/29/2011 and likely reflect a combination of prominent perivascular spaces and chronic lacunar infarcts.   A small chronic lacunar infarct within the right cerebellum is also new from the prior MRI.   Stable mild periventricular chronic small vessel ischemic disease.   Paranasal sinus disease as described, most notably moderate/severe right maxillary sinus mucosal thickening. History of present illness: He was previously healthy, highly functional, was diagnosed with prostate cancer based on PSA elevation, had pelvic radiation since August 2012, lasting for 2 months, with seed implantation,   Around 2012, he first noticed bilateral feet numbness tingling burning achy pain, couple weeks later, he noticed gait difficulty, stumbling, right worse than left, symptoms continued to get worse, plateued within 6-7 months, he had a slight improvement over the past couple months, but still had significant gait difficulty,   He denies incontinence, no bilateral hands paresthesia, no fingertips numbness tingling, no bad weakness,   MRI of lumbar reviewed, from [facility], L4-5 right posterior lateral disc extrusion with downward turning, with right L5 encroachment, moderate bilateral foraminal stenosis   Electrodiagnostic study in November 18, 2011, showed prolonged F-wave latency of all the upper extremity motor nerves tested. Absence bilateral lower extremity motor and sensory responses, activity denervations involving bilateral distal leg muscles, consistent with demyelinating polyradiculoneuropathy   CSF showed elevated TP  72, glucose 79,   Labs, normal CMP, with exception of glucose 163, PEP,cbc, lyme titer, ssa,b, tsh,ace, ana, rpr, vit b12, crp,   He began to receive IVIG treatment from home health since Sep 2013,  1g/kg every 3 weeks, he tolerated  the treatment very well, he reported significant improvement, he no longer walks with a cane, He go to church on Sunday without his ankle bracelet on sometimes. The numbness in his neck is now receding to his plantar feet only.   UPDATE June 26th 2014: He continues to show improvement with ivig, he is no longer staggering as much, walk with out assistant, wearing bilateral ankle brace, he had infusion every 3 weeks, no significant side effect noticed.   UPDATE March 6th 2015: Overall, he does very well, work 8 hours each day, he was switched from [place] to [place] since February 2015. He completed two infusions, through home health agent [facility], last infusion was March second, and third, he complains of increased bilateral feet burning, numbness, especially at the ball of his feet, he continue to walk with a brace, which has been very helpful   He has been taking Cymbalta 60 mg every morning for a while, was not sure it is helping him, he denies significant side effect   He denies bilateral upper extremity symptoms, no incontinence   UPDATE Nov 11th 2015: He is receiving ivig 0.5 g/kg x2, every 3 weeks, He continue to show mild steady improvement over time, he is no longer wearing ankle brace, he now complains of low back pain, no bowel bladder incontinence   UPDATE Feb 14 2015: He no longer wear ankle brace, ambulate without assistance, complains of bilateral plantar surface pain, especially after prolonged walking In June 2016 he developed atrial fibrillation, went through cardiac conversion successfully, now he is taking anticoagulations Xelrato   He still receiving IVIG, 1 g/kg every 3 weeks   UPDATE Feb 14 2016: He continued to show mild improvement with continued IVIG treatment, the next 10% 1 g/kg (he weighed 86 kg,= use 90 g, divided into 2 doses every 3 weeks), he is getting home infusion, hope to change the schedule to every month,   He now ambulate without ankle brace, denies  upper extremity involvement. He does have numbness, deep achy pain of bilateral foot. Cymbalta 60 mg has been helpful,   UPDATE November 14 2016: His overall doing very well, continued IVIG treatment 10%, 1g/kg divided into 2 days.   He complains some right hip pain, ambulate without ankle brace, still has significant gait abnormality,   UPDATE Feb 24 2017: He is still getting IVIG 10%, 1 g/kg every month, doing very well, continued to work full-time, he does has mild gait abnormality, dragging his right leg more,   He denied loss of sense of smell, sleeps well,   UPDATE May 18 2017: He is accompanied by his son at visit, he is over all doing well, sinemet 25/100 tid has been helpful,  He reported 50% improvement.   UpDate November 12 2017: He is overall doing well, still get benefit from ivig.  He is also taking Sinemet 25/100 tid, no significant side effect noticed.  Continue have significant gait abnormality due to distal leg weakness, numbness, Cymbalta 60 mg daily has helped his neuropathic pain  11/15/2018 JV:Mr. Macqueen is a 86 year old male who is being seen today for follow-up regarding CIDP, neuropathic pain and Parkinson's.  All follow-up conditions have been stable with ongoing use of IVIG every 4 weeks, Cymbalta 60 mg daily and Sinemet 0.5 mg twice daily.  At prior visit, complains of nausea therefore decreased dose to half tab twice daily and increase to 3 times daily if able.  He continued on twice daily dosage and denies any  worsening in any of his symptoms.  Continues to be ambulatory without use of assistive device and denies any recent falls.  No concerns at this time.  UPDATE November 17 2019: He continues to have significant gait abnormality, but is no longer using AFO, today he pulled out of 500 pounds drum, injured his right shoulder, he denies significant low back pain,  Reviewed [facility] visit in May 2021 following a fall, MRI of the brain reported right frontal scalp hematoma,  no acute abnormality, cerebral atrophy, ventriculomegaly, slight out of proportion of atrophy,  Laboratory evaluation, hemoglobin 12.8, CMP showed no significant abnormality  He continues work full-time at his [employer], his wife passed away in 14-Jul-2019, he also called Covid in December 2020, recovered well  He take his Sinemet 25/100 mg 3 times a day, seems to help his walking some  Update April 05, 2020: He was admitted to hospital in November 2021 for pneumonia, then on December 24-58 for diastolic congestive heart failure, required Lasix, was seen by cardiology recently, paroxysmal atrial fibrillation, coronary artery disease, on Xarelto treatment, status post a stent,  [facility]IG infusion [facility] ([facility], contact [place], [phone])  was in October 2021, missing November and December infusion, he was noted to have increased gait abnormality  In 2017-07-13, he was also diagnosed with Parkinson's disease, started on Sinemet 25/100 mg 3 times daily, he did have some improvement at that time,   Laboratory evaluations in March 29, 2020, creatinine 1.08, CBC showed hemoglobin of 10.9,  UPDATE May 02 2020: He is accompanied by his 2 sons at today's clinical visit, following last visit on April 05, 2020, his Sinemet dosage was increased from 25/100 mg 1 tablet 3 times a day to 2 tablets 3 times a day  On further questioning, patient was only taking Sinemet 25/100 mg half tablet twice a day, instead of titrating up the dosage, he began to take 2 tablets 3 times a day, concerning his worsening gait abnormality, he complains of significant GI side effect with higher dose, was able to go to new year trip with his family, received IVIG treatment on December 27, 28th, no significant side effect noted following infusion  Then he was noted to have slow decline gait abnormality, functional status after the trip at the beginning of January, fell multiple times, could not  get up and walk independently over the past few days,  Patient denies significant pain, no mental confusion, no bowel bladder incontinence, Sinemet dosage has decreased to previous 25/100 half tablet twice a day,  UPDATE August 07 2020: He is accompanied by his son at today's clinical visit, wearing bilateral AFO, ambulating better, still rely on his cane, still works full-time, drives a forklift sometimes, his son denies significant memory loss, but today's MoCA examination is only 16/30,  MRI of the brain showed significant atrophy, ventriculomegaly, much progressed compared to previous MRI in 2011/07/14  Update April 30, 2021, He is accompanied by his son Marden Noble at today's visit, continue to work full-time at his own [employer], educational, ambulate with significant difficulty, only takes Sinemet 25/100 mg 2 tablets a day, MoCA examination in April 2022 was only 16 out of 30  Laboratory evaluation showed significantly elevated TSH 55, indicating hypothyroidism, there was no thyroid supplement on medication list, will forward the labs to his primary care Futrell, Gildardo Griffes., MD again to address the issue.  Also evidence of A1c 6.6, consistent with diagnosis of diabetes  He reports some  improvement with IVIG, hope to continue,  Update October 29, 2021 SS:   REVIEW OF SYSTEMS: Out of a complete 14 system review of symptoms, the patient complains only of the following symptoms, and all other reviewed systems are negative.  As above    ALLERGIES: Allergies  Allergen Reactions   Ezetimibe     Other reaction(s): Arthralgias (intolerance)   Fenofibrate Micronized     Other reaction(s): Arthralgias (intolerance)   Statins     Other reaction(s): Arthralgias (intolerance)    HOME MEDICATIONS: Outpatient Medications Prior to Visit  Medication Sig Dispense Refill   amiodarone (PACERONE) 200 MG tablet Take 200 mg by mouth 2 (two) times daily.     carbidopa-levodopa (SINEMET IR) 25-100 MG tablet Take 2 tablets  by mouth 3 (three) times daily. 180 tablet 11   DULoxetine (CYMBALTA) 60 MG capsule Take 1 capsule (60 mg total) by mouth daily. 90 capsule 3   furosemide (LASIX) 20 MG tablet as needed. Increase in weight of 2-3 lbs overnight     Immune Globulin 10% (GAMUNEX-C) 10 GM/100ML SOLN Inject 1 g/kg into the vein every 30 (thirty) days.     lisinopril (PRINIVIL,ZESTRIL) 10 MG tablet Take 10 mg by mouth daily.     metFORMIN (GLUCOPHAGE) 500 MG tablet Take by mouth daily.     metoprolol tartrate (LOPRESSOR) 25 MG tablet Take 25 mg by mouth daily.     olmesartan (BENICAR) 40 MG tablet Take 40 mg by mouth daily.     rivaroxaban (XARELTO) 20 MG TABS tablet Take 20 mg by mouth daily.     No facility-administered medications prior to visit.    PAST MEDICAL HISTORY: Past Medical History:  Diagnosis Date   Abnormality of gait 07/07/2012   CIDP (chronic inflammatory demyelinating polyneuropathy) (HCC)    CIDP (chronic inflammatory demyelinating polyneuropathy) ([place]) 07/07/2012   Foot drop    Gait abnormality    Heart disease    Irregular heartbeat    Pre-diabetes    Prostate cancer ([place])     PAST SURGICAL HISTORY: No past surgical history on file.  FAMILY HISTORY: No family history on file.  SOCIAL HISTORY: Social History   Socioeconomic History   Marital status: Widowed    Spouse name: Webb Silversmith   Number of children: 2   Years of education: Not on file   Highest education level: Not on file  Occupational History   Not on file  Tobacco Use   Smoking status: Never   Smokeless tobacco: Never  Substance and Sexual Activity   Alcohol use: No    Alcohol/week: 0.0 standard drinks of alcohol   Drug use: No   Sexual activity: Not on file  Other Topics Concern   Not on file  Social History Narrative   Patient lives alone. His son stays with him at night. He is still working. Two children.Right handed.   Caffeine- two cups.      Social Determinants of Health   Financial Resource Strain:  Not on file  Food Insecurity: Not on file  Transportation Needs: Not on file  Physical Activity: Not on file  Stress: Not on file  Social Connections: Not on file  Intimate Partner Violence: Not on file   PHYSICAL EXAM  There were no vitals filed for this visit.  There is no height or weight on file to calculate BMI.   PHYSICAL EXAMNIATION:  Gen: NAD, conversant, well nourised, well groomed  Cardiovascular: Regular rate rhythm, no peripheral edema, warm, nontender. Eyes: Conjunctivae clear without exudates or hemorrhage Neck: Supple, no carotid bruits. Pulmonary: Clear to auscultation bilaterally   NEUROLOGICAL EXAM:  MENTAL STATUS:    08/07/2020   11:00 AM  Montreal Cognitive Assessment   Visuospatial/ Executive (0/5) 0  Naming (0/3) 2  Attention: Read list of digits (0/2) 2  Attention: Read list of letters (0/1) 1  Attention: Serial 7 subtraction starting at 100 (0/3) 2  Language: Repeat phrase (0/2) 2  Language : Fluency (0/1) 0  Abstraction (0/2) 2  Delayed Recall (0/5) 0  Orientation (0/6) 5  Total 16   CRANIAL NERVES: CN II: Visual fields are full to confrontation.  Pupils are round equal and briskly reactive to light. CN III, IV, VI: extraocular movement are normal. No ptosis. CN V: Facial sensation is intact to pinprick in all 3 divisions bilaterally. Corneal responses are intact.  CN VII: Face is symmetric with normal eye closure and smile. CN VIII: Hearing is normal to casual conversation CN IX, X: Phonation is normal. CN XI: Head turning and shoulder shrug are intact   MOTOR: Fixation of left upper extremity on rapid rotating movement, no significant upper extremity muscle weakness, other than mild bilateral finger abduction/grip weakness, left more than right moderate rigidity bradykinesia, increased with reinforcement maneuver,  Mild bilateral hip flexion weakness, mild bilateral knee flexion, extension weakness, severe ankle  dorsiflexion weakness, right worse than left,  REFLEXES: Areflexia  SENSORY: Length dependent decreased vibratory sensation, pinprick to mid shin level, loss of toe proprioception  COORDINATION: Rapid alternating movements and fine finger movements are intact. There is no dysmetria on finger-to-nose and heel-knee-shin.    GAIT/STANCE: He needs help to get up from seated position, very unsteady, wear bilateral AFO, unsteady   Butler Denmark, Laqueta Jean, DNP  [facility] [address] [place], [place] [address] [phone]   .

## 2021-10-29 ENCOUNTER — Ambulatory Visit: Payer: Medicare Other | Admitting: Neurology

## 2021-11-20 ENCOUNTER — Ambulatory Visit: Payer: Medicare Other | Admitting: Neurology

## 2021-11-20 ENCOUNTER — Encounter: Payer: Self-pay | Admitting: Neurology

## 2021-11-20 VITALS — BP 155/74 | HR 75 | Ht 71.0 in | Wt 176.5 lb

## 2021-11-20 DIAGNOSIS — R269 Unspecified abnormalities of gait and mobility: Secondary | ICD-10-CM | POA: Diagnosis not present

## 2021-11-20 DIAGNOSIS — G20 Parkinson's disease: Secondary | ICD-10-CM | POA: Diagnosis not present

## 2021-11-20 DIAGNOSIS — G6181 Chronic inflammatory demyelinating polyneuritis: Secondary | ICD-10-CM | POA: Diagnosis not present

## 2021-11-20 NOTE — Patient Instructions (Signed)
Increase Sinemet, start taking 1 tablet 3 times daily, increase by 1 tablet weekly until you are taking 2 tablets 3 times daily  Continue IVIG, be careful not to fall  See you back in 6 months

## 2021-11-20 NOTE — Progress Notes (Signed)
ASSESSMENT AND PLAN 86 y.o. year old male  Chronic inflammatory demyelinating polyneuropathy  Continue IVIG via home health every 4 weeks, feels it is benefiting him to continue walking, remain so active Has been on every months IVIG 1 g/kg since 2013, through home agent, tolerating it well  EMG nerve conduction study in August 2018 showed no significant change compared to previous study in April 2015, continued evidence of chronic demyelinating polyradicular neuropathy, with superimposed right median neuropathy, no significant axonal loss based on EMG findings  Continue Cymbalta 60 mg for neuropathic pain  Parkinsonism  Slowly Increase Sinemet 2 tablets 3 times daily, currently only doing 1 tablet twice daily, significant parkinsonian features   Worsening gait abnormality  Multifactorial, due to bilateral foot drop, parkinsonism, brain atrophy, periventricular white matter disease  Is taking Xarelto due to atrial fibrillation, high risk of fall, and related complications,  Discussed importance of safety with ambulation  Cognitive impairment  Continues to work full-time, has physical job, still makes some decisions with son's input at his company  MoCA examination was 16 out of 30 in April 2022   MRI of the brain showed generalized atrophy, ventriculomegaly, much progressed compared to previous MRI scan in 2013  Laboratory evaluation showed normal RPR, B12, significantly elevated TSH, now on Synthroid, under better control  DIAGNOSTIC DATA (LABS, IMAGING, TESTING) - I reviewed patient records, labs, notes, testing and imaging myself where available. MRI of brain in January 2022   No evidence of acute infarction.   Moderate cerebral atrophy has progressed as compared to the head CT of 10/31/2014.   Mild lateral and third ventriculomegaly may be related to progressive cerebral atrophy. However, there are some features to suggest normal pressure hydrocephalus and clinical  correlation is recommended.   Subcentimeter foci of T2 hyperintensity within the bilateral basal ganglia are new as compared to the prior MRI of 11/29/2011 and likely reflect a combination of prominent perivascular spaces and chronic lacunar infarcts.   A small chronic lacunar infarct within the right cerebellum is also new from the prior MRI.   Stable mild periventricular chronic small vessel ischemic disease.   Paranasal sinus disease as described, most notably moderate/severe right maxillary sinus mucosal thickening. History of present illness: He was previously healthy, highly functional, was diagnosed with prostate cancer based on PSA elevation, had pelvic radiation since August 2012, lasting for 2 months, with seed implantation,   Around 2012, he first noticed bilateral feet numbness tingling burning achy pain, couple weeks later, he noticed gait difficulty, stumbling, right worse than left, symptoms continued to get worse, plateued within 6-7 months, he had a slight improvement over the past couple months, but still had significant gait difficulty,   He denies incontinence, no bilateral hands paresthesia, no fingertips numbness tingling, no bad weakness,   MRI of lumbar reviewed, from [facility], L4-5 right posterior lateral disc extrusion with downward turning, with right L5 encroachment, moderate bilateral foraminal stenosis   Electrodiagnostic study in November 18, 2011, showed prolonged F-wave latency of all the upper extremity motor nerves tested. Absence bilateral lower extremity motor and sensory responses, activity denervations involving bilateral distal leg muscles, consistent with demyelinating polyradiculoneuropathy   CSF showed elevated TP  72, glucose 79,   Labs, normal CMP, with exception of glucose 163, PEP,cbc, lyme titer, ssa,b, tsh,ace, ana, rpr, vit b12, crp,   He began to receive IVIG treatment from home health since Sep 2013,  1g/kg every 3 weeks, he  tolerated the treatment  very well, he reported significant improvement, he no longer walks with a cane, He go to church on Sunday without his ankle bracelet on sometimes. The numbness in his neck is now receding to his plantar feet only.   UPDATE June 26th 2014: He continues to show improvement with ivig, he is no longer staggering as much, walk with out assistant, wearing bilateral ankle brace, he had infusion every 3 weeks, no significant side effect noticed.   UPDATE March 6th 2015: Overall, he does very well, work 8 hours each day, he was switched from [place] to [place] since February 2015. He completed two infusions, through home health agent [facility], last infusion was March second, and third, he complains of increased bilateral feet burning, numbness, especially at the ball of his feet, he continue to walk with a brace, which has been very helpful   He has been taking Cymbalta 60 mg every morning for a while, was not sure it is helping him, he denies significant side effect   He denies bilateral upper extremity symptoms, no incontinence   UPDATE Nov 11th 2015: He is receiving ivig 0.5 g/kg x2, every 3 weeks, He continue to show mild steady improvement over time, he is no longer wearing ankle brace, he now complains of low back pain, no bowel bladder incontinence   UPDATE Feb 14 2015: He no longer wear ankle brace, ambulate without assistance, complains of bilateral plantar surface pain, especially after prolonged walking In June 2016 he developed atrial fibrillation, went through cardiac conversion successfully, now he is taking anticoagulations Xelrato   He still receiving IVIG, 1 g/kg every 3 weeks   UPDATE Feb 14 2016: He continued to show mild improvement with continued IVIG treatment, the next 10% 1 g/kg (he weighed 86 kg,= use 90 g, divided into 2 doses every 3 weeks), he is getting home infusion, hope to change the schedule to every month,   He now ambulate without ankle brace,  denies upper extremity involvement. He does have numbness, deep achy pain of bilateral foot. Cymbalta 60 mg has been helpful,   UPDATE November 14 2016: His overall doing very well, continued IVIG treatment 10%, 1g/kg divided into 2 days.   He complains some right hip pain, ambulate without ankle brace, still has significant gait abnormality,   UPDATE Feb 24 2017: He is still getting IVIG 10%, 1 g/kg every month, doing very well, continued to work full-time, he does has mild gait abnormality, dragging his right leg more,   He denied loss of sense of smell, sleeps well,   UPDATE May 18 2017: He is accompanied by his son at visit, he is over all doing well, sinemet 25/100 tid has been helpful,  He reported 50% improvement.   UpDate November 12 2017: He is overall doing well, still get benefit from ivig.  He is also taking Sinemet 25/100 tid, no significant side effect noticed.  Continue have significant gait abnormality due to distal leg weakness, numbness, Cymbalta 60 mg daily has helped his neuropathic pain  11/15/2018 JV:Mr. Mayorquin is a 86 year old male who is being seen today for follow-up regarding CIDP, neuropathic pain and Parkinson's.  All follow-up conditions have been stable with ongoing use of IVIG every 4 weeks, Cymbalta 60 mg daily and Sinemet 0.5 mg twice daily.  At prior visit, complains of nausea therefore decreased dose to half tab twice daily and increase to 3 times daily if able.  He continued on twice daily dosage and denies any worsening in  any of his symptoms.  Continues to be ambulatory without use of assistive device and denies any recent falls.  No concerns at this time.  UPDATE November 17 2019: He continues to have significant gait abnormality, but is no longer using AFO, today he pulled out of 500 pounds drum, injured his right shoulder, he denies significant low back pain,  Reviewed [facility] visit in May 2021 following a fall, MRI of the brain reported right frontal scalp  hematoma, no acute abnormality, cerebral atrophy, ventriculomegaly, slight out of proportion of atrophy,  Laboratory evaluation, hemoglobin 12.8, CMP showed no significant abnormality  He continues work full-time at his [employer], his wife passed away in 06/28/19, he also called Covid in December 2020, recovered well  He take his Sinemet 25/100 mg 3 times a day, seems to help his walking some  Update April 05, 2020: He was admitted to hospital in November 2021 for pneumonia, then on December 95-09 for diastolic congestive heart failure, required Lasix, was seen by cardiology recently, paroxysmal atrial fibrillation, coronary artery disease, on Xarelto treatment, status post a stent,  [facility]IG infusion [facility] ([facility], contact [place], [phone])  was in October 2021, missing November and December infusion, he was noted to have increased gait abnormality  In 06/27/2017, he was also diagnosed with Parkinson's disease, started on Sinemet 25/100 mg 3 times daily, he did have some improvement at that time,   Laboratory evaluations in March 29, 2020, creatinine 1.08, CBC showed hemoglobin of 10.9,  UPDATE May 02 2020: He is accompanied by his 2 sons at today's clinical visit, following last visit on April 05, 2020, his Sinemet dosage was increased from 25/100 mg 1 tablet 3 times a day to 2 tablets 3 times a day  On further questioning, patient was only taking Sinemet 25/100 mg half tablet twice a day, instead of titrating up the dosage, he began to take 2 tablets 3 times a day, concerning his worsening gait abnormality, he complains of significant GI side effect with higher dose, was able to go to new year trip with his family, received IVIG treatment on December 27, 28th, no significant side effect noted following infusion  Then he was noted to have slow decline gait abnormality, functional status after the trip at the beginning of January, fell multiple times,  could not get up and walk independently over the past few days,  Patient denies significant pain, no mental confusion, no bowel bladder incontinence, Sinemet dosage has decreased to previous 25/100 half tablet twice a day,  UPDATE August 07 2020: He is accompanied by his son at today's clinical visit, wearing bilateral AFO, ambulating better, still rely on his cane, still works full-time, drives a forklift sometimes, his son denies significant memory loss, but today's MoCA examination is only 16/30,  MRI of the brain showed significant atrophy, ventriculomegaly, much progressed compared to previous MRI in 28-Jun-2011  Update April 30, 2021, He is accompanied by his son Marden Noble at today's visit, continue to work full-time at his own [employer], educational, ambulate with significant difficulty, only takes Sinemet 25/100 mg 2 tablets a day, MoCA examination in April 2022 was only 16 out of 30  Laboratory evaluation showed significantly elevated TSH 55, indicating hypothyroidism, there was no thyroid supplement on medication list, will forward the labs to his primary care Futrell, Gildardo Griffes., MD again to address the issue.  Also evidence of A1c 6.6, consistent with diagnosis of diabetes  He reports some improvement with  IVIG, hope to continue,  Update November 20, 2021 SS: Still on IVIG, every 4 weeks. Keeps him walking, wishes to continue. 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 Uses cane, walker at night at home, at work he doesn't use anything.  No falls.  Bilateral AFOs are very helpful.  Thyroid is now regulated, on Synthroid.  Admitted March 2023 for CHF exacerbation, combination of inactivity, change in IVIG duration of infusion. Now over about 6 hours.  On Sinemet 1 tablet twice daily. Here with son, Marden Noble.   REVIEW OF SYSTEMS: Out of a complete 14 system review of symptoms, the patient complains only of the following symptoms, and all other reviewed systems are negative.    See  HPI  ALLERGIES: Allergies  Allergen Reactions   Ezetimibe     Other reaction(s): Arthralgias (intolerance)   Fenofibrate Micronized     Other reaction(s): Arthralgias (intolerance)   Statins     Other reaction(s): Arthralgias (intolerance)    HOME MEDICATIONS: Outpatient Medications Prior to Visit  Medication Sig Dispense Refill   amiodarone (PACERONE) 200 MG tablet Take 200 mg by mouth 2 (two) times daily.     carbidopa-levodopa (SINEMET IR) 25-100 MG tablet Take 2 tablets by mouth 3 (three) times daily. 180 tablet 11   DULoxetine (CYMBALTA) 60 MG capsule Take 1 capsule (60 mg total) by mouth daily. 90 capsule 3   furosemide (LASIX) 20 MG tablet as needed. Increase in weight of 2-3 lbs overnight     Immune Globulin 10% (GAMUNEX-C) 10 GM/100ML SOLN Inject 1 g/kg into the vein every 30 (thirty) days.     lisinopril (PRINIVIL,ZESTRIL) 10 MG tablet Take 10 mg by mouth daily.     metFORMIN (GLUCOPHAGE) 500 MG tablet Take by mouth daily.     metoprolol tartrate (LOPRESSOR) 25 MG tablet Take 25 mg by mouth daily.     olmesartan (BENICAR) 40 MG tablet Take 40 mg by mouth daily.     rivaroxaban (XARELTO) 20 MG TABS tablet Take 20 mg by mouth daily.     No facility-administered medications prior to visit.    PAST MEDICAL HISTORY: Past Medical History:  Diagnosis Date   Abnormality of gait 07/07/2012   CIDP (chronic inflammatory demyelinating polyneuropathy) ([facility])    CIDP (chronic inflammatory demyelinating polyneuropathy) ([place]) 07/07/2012   Foot drop    Gait abnormality    Heart disease    Irregular heartbeat    Pre-diabetes    Prostate cancer ([place])     PAST SURGICAL HISTORY: History reviewed. No pertinent surgical history.  FAMILY HISTORY: History reviewed. No pertinent family history.  SOCIAL HISTORY: Social History   Socioeconomic History   Marital status: Widowed    Spouse name: Webb Silversmith   Number of children: 2   Years of education: Not on file   Highest education  level: Not on file  Occupational History   Not on file  Tobacco Use   Smoking status: Never   Smokeless tobacco: Never  Substance and Sexual Activity   Alcohol use: No    Alcohol/week: 0.0 standard drinks of alcohol   Drug use: No   Sexual activity: Not on file  Other Topics Concern   Not on file  Social History Narrative   Patient lives alone. His son stays with him at night. He is still working. Two children.Right handed.   Caffeine- two cups.      Social Determinants of Health   Financial Resource Strain: Not on file  Food  Insecurity: Not on file  Transportation Needs: Not on file  Physical Activity: Not on file  Stress: Not on file  Social Connections: Not on file  Intimate Partner Violence: Not on file   PHYSICAL EXAM  Vitals:   11/20/21 0814  BP: (!) 155/74  Pulse: 75  Weight: 176 lb 8 oz (80.1 kg)  Height: '5\' 11"'$  (1.803 m)    Body mass index is 24.62 kg/m.  PHYSICAL EXAMNIATION:  Gen: NAD, conversant, well nourised, very pleasant   NEUROLOGICAL EXAM:  MENTAL STATUS:    08/07/2020   11:00 AM  Montreal Cognitive Assessment   Visuospatial/ Executive (0/5) 0  Naming (0/3) 2  Attention: Read list of digits (0/2) 2  Attention: Read list of letters (0/1) 1  Attention: Serial 7 subtraction starting at 100 (0/3) 2  Language: Repeat phrase (0/2) 2  Language : Fluency (0/1) 0  Abstraction (0/2) 2  Delayed Recall (0/5) 0  Orientation (0/6) 5  Total 16   CRANIAL NERVES: CN II: Visual fields are full to confrontation.  Pupils are round equal and briskly reactive to light. CN III, IV, VI: extraocular movement are normal. No ptosis. CN V: Facial sensation is intact to pinprick in all 3 divisions bilaterally.  CN VII: Face is symmetric with normal eye closure and smile. CN VIII: Hearing is normal to casual conversation CN IX, X: Phonation is normal. CN XI: Head turning and shoulder shrug are intact  MOTOR: Bilateral foot drop, mild generalized weakness,  right arm increased rigidity, bradykinesia compared to left.  Resting tremor greater on the right than the left.  REFLEXES: Absent  SENSORY: Length dependent decreased soft touch sensation  COORDINATION: Rapid alternating movements and fine finger movements are intact. There is no dysmetria on finger-to-nose and heel-knee-shin.    GAIT/STANCE: Has to push off from seated position to stand, needs assistance, bilateral AFOs, gait is slow, shuffling, uses cane, unsteady  Butler Denmark, Laqueta Jean, DNP  [facility] [address] [place], [place] [address] [phone]  .

## 2022-04-16 ENCOUNTER — Telehealth: Payer: Self-pay | Admitting: Neurology

## 2022-04-16 NOTE — Telephone Encounter (Signed)
Megan from [facility] called to report that the infusion RN reports that pt may have been recently hospitalized due to fluid retention, they are asking for a call to relay details.

## 2022-04-17 NOTE — Telephone Encounter (Signed)
Attempted to call nurse back and was placed on a wait list for 20 plus minutes and was unable to leave a VM.

## 2022-04-17 NOTE — Telephone Encounter (Signed)
Spoke with the Pharmacist, AJ at [facility] and the note from [place] stated she want to relay that the patient's "navigator" assigned to the patient by [facility] was told by patient's neighbor that the patient may or may not be in the hospital for fluid retention but they are unable to confirm, but the pharmacists wanted to recommend that if this is the case then they would recommend that the patient receive IV lasix with the IVIG from now on as a precaution to prevent the fluid retention in the future.

## 2022-04-28 ENCOUNTER — Telehealth: Payer: Self-pay

## 2022-04-28 NOTE — Telephone Encounter (Signed)
PA for Gamunex C submitted via CMM (Key: [id number])  [facility] is reviewing your PA request. Typically an electronic response will be received within 24-72 hours. To check for an update later, open this request from your dashboard.  You may close this dialog and return to your dashboard to perform other tasks.

## 2022-04-29 ENCOUNTER — Telehealth: Payer: Self-pay | Admitting: Neurology

## 2022-04-29 NOTE — Telephone Encounter (Signed)
PA completed as requested  (Key: [id number])  Your information has been sent to [facility].

## 2022-04-29 NOTE — Telephone Encounter (Signed)
Manuel Sampson is calling. Stated she need submitting of cover my med key code [place].

## 2022-04-29 NOTE — Telephone Encounter (Signed)
Received Partially coverage notice from pt's insurance.

## 2022-05-01 NOTE — Telephone Encounter (Signed)
I received e-mail from [facility] case manger on this pt and she sts pa received and she would work with intake to schedule infusion.

## 2022-05-05 NOTE — Telephone Encounter (Signed)
Received updated infusion orders, placed on MD's desk for signature.

## 2022-05-19 ENCOUNTER — Ambulatory Visit: Payer: Medicare Other | Admitting: Neurology

## 2022-06-13 DEATH — deceased
